# Patient Record
Sex: Female | Born: 1950 | Race: Black or African American | Hispanic: No | Marital: Single | Smoking: Never smoker
Health system: Southern US, Community
[De-identification: ages and names within clinical notes are randomized; demographics above are authoritative.]

## PROBLEM LIST (undated history)

## (undated) DIAGNOSIS — E119 Type 2 diabetes mellitus without complications: Secondary | ICD-10-CM

## (undated) DIAGNOSIS — K648 Other hemorrhoids: Secondary | ICD-10-CM

## (undated) DIAGNOSIS — E669 Obesity, unspecified: Secondary | ICD-10-CM

## (undated) DIAGNOSIS — H9313 Tinnitus, bilateral: Secondary | ICD-10-CM

## (undated) DIAGNOSIS — Z8679 Personal history of other diseases of the circulatory system: Secondary | ICD-10-CM

## (undated) DIAGNOSIS — E049 Nontoxic goiter, unspecified: Secondary | ICD-10-CM

## (undated) DIAGNOSIS — Z801 Family history of malignant neoplasm of trachea, bronchus and lung: Secondary | ICD-10-CM

## (undated) DIAGNOSIS — Z8719 Personal history of other diseases of the digestive system: Secondary | ICD-10-CM

## (undated) DIAGNOSIS — R1312 Dysphagia, oropharyngeal phase: Secondary | ICD-10-CM

## (undated) DIAGNOSIS — K5790 Diverticulosis of intestine, part unspecified, without perforation or abscess without bleeding: Secondary | ICD-10-CM

## (undated) DIAGNOSIS — M542 Cervicalgia: Secondary | ICD-10-CM

## (undated) DIAGNOSIS — K219 Gastro-esophageal reflux disease without esophagitis: Secondary | ICD-10-CM

## (undated) DIAGNOSIS — F411 Generalized anxiety disorder: Secondary | ICD-10-CM

## (undated) DIAGNOSIS — R252 Cramp and spasm: Secondary | ICD-10-CM

## (undated) DIAGNOSIS — E785 Hyperlipidemia, unspecified: Secondary | ICD-10-CM

## (undated) DIAGNOSIS — I700 Atherosclerosis of aorta: Secondary | ICD-10-CM

## (undated) DIAGNOSIS — Z800 Family history of malignant neoplasm of digestive organs: Secondary | ICD-10-CM

## (undated) DIAGNOSIS — Z83719 Family history of colon polyps, unspecified: Secondary | ICD-10-CM

## (undated) DIAGNOSIS — Z8041 Family history of malignant neoplasm of ovary: Secondary | ICD-10-CM

## (undated) DIAGNOSIS — J309 Allergic rhinitis, unspecified: Secondary | ICD-10-CM

## (undated) DIAGNOSIS — R7989 Other specified abnormal findings of blood chemistry: Secondary | ICD-10-CM

## (undated) DIAGNOSIS — G629 Polyneuropathy, unspecified: Secondary | ICD-10-CM

## (undated) DIAGNOSIS — D229 Melanocytic nevi, unspecified: Secondary | ICD-10-CM

## (undated) DIAGNOSIS — N393 Stress incontinence (female) (male): Secondary | ICD-10-CM

## (undated) DIAGNOSIS — Z8371 Family history of colonic polyps: Secondary | ICD-10-CM

## (undated) DIAGNOSIS — R296 Repeated falls: Secondary | ICD-10-CM

## (undated) DIAGNOSIS — Z806 Family history of leukemia: Secondary | ICD-10-CM

## (undated) DIAGNOSIS — Z8042 Family history of malignant neoplasm of prostate: Secondary | ICD-10-CM

## (undated) DIAGNOSIS — M7551 Bursitis of right shoulder: Secondary | ICD-10-CM

## (undated) DIAGNOSIS — R42 Dizziness and giddiness: Secondary | ICD-10-CM

## (undated) DIAGNOSIS — M1990 Unspecified osteoarthritis, unspecified site: Secondary | ICD-10-CM

## (undated) DIAGNOSIS — M5412 Radiculopathy, cervical region: Secondary | ICD-10-CM

## (undated) DIAGNOSIS — R591 Generalized enlarged lymph nodes: Secondary | ICD-10-CM

## (undated) HISTORY — DX: Allergic rhinitis, unspecified: J30.9

## (undated) HISTORY — PX: CHOLECYSTECTOMY: SHX55

## (undated) HISTORY — DX: Nontoxic goiter, unspecified: E04.9

## (undated) HISTORY — DX: Family history of leukemia: Z80.6

## (undated) HISTORY — DX: Dysphagia, oropharyngeal phase: R13.12

## (undated) HISTORY — DX: Family history of malignant neoplasm of prostate: Z80.42

## (undated) HISTORY — DX: Unspecified osteoarthritis, unspecified site: M19.90

## (undated) HISTORY — DX: Obesity, unspecified: E66.9

## (undated) HISTORY — DX: Personal history of other diseases of the circulatory system: Z86.79

## (undated) HISTORY — DX: Other specified abnormal findings of blood chemistry: R79.89

## (undated) HISTORY — DX: Repeated falls: R29.6

## (undated) HISTORY — DX: Family history of colonic polyps: Z83.71

## (undated) HISTORY — DX: Generalized enlarged lymph nodes: R59.1

## (undated) HISTORY — DX: Atherosclerosis of aorta: I70.0

## (undated) HISTORY — DX: Hyperlipidemia, unspecified: E78.5

## (undated) HISTORY — DX: Tinnitus, bilateral: H93.13

## (undated) HISTORY — DX: Stress incontinence (female) (male): N39.3

## (undated) HISTORY — PX: TONSILLECTOMY AND ADENOIDECTOMY: SUR1326

## (undated) HISTORY — DX: Dizziness and giddiness: R42

## (undated) HISTORY — DX: Cervicalgia: M54.2

## (undated) HISTORY — DX: Radiculopathy, cervical region: M54.12

## (undated) HISTORY — DX: Diverticulosis of intestine, part unspecified, without perforation or abscess without bleeding: K57.90

## (undated) HISTORY — DX: Generalized anxiety disorder: F41.1

## (undated) HISTORY — DX: Gastro-esophageal reflux disease without esophagitis: K21.9

## (undated) HISTORY — DX: Family history of malignant neoplasm of ovary: Z80.41

## (undated) HISTORY — DX: Personal history of other diseases of the digestive system: Z87.19

## (undated) HISTORY — DX: Cramp and spasm: R25.2

## (undated) HISTORY — DX: Melanocytic nevi, unspecified: D22.9

## (undated) HISTORY — DX: Other hemorrhoids: K64.8

## (undated) HISTORY — DX: Family history of malignant neoplasm of digestive organs: Z80.0

## (undated) HISTORY — DX: Family history of malignant neoplasm of trachea, bronchus and lung: Z80.1

## (undated) HISTORY — DX: Family history of colon polyps, unspecified: Z83.719

## (undated) HISTORY — DX: Hypomagnesemia: E83.42

## (undated) HISTORY — DX: Polyneuropathy, unspecified: G62.9

## (undated) HISTORY — DX: Bursitis of right shoulder: M75.51

---

## 1992-10-23 HISTORY — PX: LAPAROSCOPIC TUBAL LIGATION: SUR803

## 1998-10-23 HISTORY — PX: COLONOSCOPY: SHX174

## 1998-12-22 HISTORY — PX: ESOPHAGOGASTRODUODENOSCOPY: SHX1529

## 2000-10-23 HISTORY — PX: ESOPHAGEAL DILATION: SHX303

## 2003-10-24 HISTORY — PX: COLONOSCOPY: SHX174

## 2004-08-23 HISTORY — PX: ESOPHAGOGASTRODUODENOSCOPY: SHX1529

## 2004-09-01 ENCOUNTER — Ambulatory Visit: Payer: Self-pay | Admitting: Gastroenterology

## 2005-12-22 ENCOUNTER — Ambulatory Visit: Payer: Self-pay | Admitting: Internal Medicine

## 2007-01-03 ENCOUNTER — Ambulatory Visit: Payer: Self-pay | Admitting: Internal Medicine

## 2007-06-08 ENCOUNTER — Encounter: Admission: RE | Admit: 2007-06-08 | Discharge: 2007-06-08 | Payer: Self-pay | Admitting: Internal Medicine

## 2008-01-15 ENCOUNTER — Ambulatory Visit: Payer: Self-pay | Admitting: Internal Medicine

## 2008-04-26 ENCOUNTER — Emergency Department: Payer: Self-pay | Admitting: Emergency Medicine

## 2009-10-24 ENCOUNTER — Ambulatory Visit: Payer: Self-pay | Admitting: Orthopedic Surgery

## 2009-11-30 ENCOUNTER — Ambulatory Visit: Payer: Self-pay | Admitting: Pain Medicine

## 2009-12-16 ENCOUNTER — Ambulatory Visit: Payer: Self-pay | Admitting: Pain Medicine

## 2009-12-23 ENCOUNTER — Ambulatory Visit: Payer: Self-pay | Admitting: Pain Medicine

## 2010-01-06 ENCOUNTER — Ambulatory Visit: Payer: Self-pay | Admitting: Pain Medicine

## 2010-01-20 ENCOUNTER — Ambulatory Visit: Payer: Self-pay | Admitting: Pain Medicine

## 2010-03-31 ENCOUNTER — Ambulatory Visit: Payer: Self-pay | Admitting: Internal Medicine

## 2011-06-13 ENCOUNTER — Ambulatory Visit: Payer: Self-pay | Admitting: Internal Medicine

## 2011-07-24 HISTORY — PX: COLONOSCOPY: SHX174

## 2011-07-27 ENCOUNTER — Ambulatory Visit: Payer: Self-pay | Admitting: Gastroenterology

## 2011-07-27 HISTORY — PX: ESOPHAGOGASTRODUODENOSCOPY: SHX1529

## 2011-07-28 ENCOUNTER — Emergency Department: Payer: Self-pay | Admitting: Emergency Medicine

## 2011-08-02 LAB — PATHOLOGY REPORT

## 2013-05-05 ENCOUNTER — Emergency Department: Payer: Self-pay | Admitting: Emergency Medicine

## 2014-01-01 ENCOUNTER — Ambulatory Visit: Payer: Self-pay | Admitting: Internal Medicine

## 2014-01-20 ENCOUNTER — Ambulatory Visit: Payer: Self-pay | Admitting: Gastroenterology

## 2014-01-28 ENCOUNTER — Ambulatory Visit: Payer: Self-pay | Admitting: Gastroenterology

## 2014-02-12 ENCOUNTER — Ambulatory Visit: Payer: Self-pay | Admitting: Gastroenterology

## 2014-03-26 ENCOUNTER — Ambulatory Visit: Payer: Self-pay | Admitting: Surgery

## 2014-04-03 ENCOUNTER — Ambulatory Visit: Payer: Self-pay | Admitting: Surgery

## 2014-04-06 LAB — PATHOLOGY REPORT

## 2015-02-13 NOTE — Op Note (Signed)
PATIENT NAME:  Kayla Love, Kayla Love MR#:  [id number] DATE OF BIRTH:  1951-07-16  DATE OF PROCEDURE:  04/03/2014  PREOPERATIVE DIAGNOSIS:  Ventral hernia, chronic cholecystitis, cholelithiasis.   POSTOPERATIVE DIAGNOSIS:  Ventral hernia, acute cholecystitis, cholelithiasis.   PROCEDURES PERFORMED: Laparoscopic cholecystectomy and open ventral hernia repair.   SURGEON:  Rochel Brome, M.D.   ANESTHESIA:  General.   INDICATIONS: This 64 year old female has a history of epigastric pains, also had physical findings of an epigastric ventral hernia, which was small in size, but symptomatic. She also had ultrasound findings of gallstones. Surgery was recommended for definitive treatment.   DESCRIPTION OF PROCEDURE:  The patient was placed on the operating table in the supine position under general endotracheal anesthesia. The abdomen was prepared with ChloraPrep and draped in a sterile manner.   The ventral hernia was done first so as to isolate it from potential contamination related to the gallbladder. A longitudinal incision was made in the epigastrium just approximately 3 cm in length with the lower end just above the umbilicus. This was carried down through subcutaneous tissues. Several small bleeding points were cauterized. There was a ventral hernia sac, which was encountered, which was approximately 3 cm in length, which was dissected free from surrounding structures down to a fascial ring defect, which was approximately 1 cm in dimension. The peritoneum of the sac was dissected away from the fascial ring defect and was inverted. Next, the repair was carried out with a transversely oriented suture line of interrupted 0 Maxon figure-of-eight sutures. The repair looked good. Hemostasis was intact. Subcutaneous tissues were approximated with 4-0 Monocryl pursestring suture. Next, the skin was closed with a running 4-0 Monocryl subcuticular suture and Dermabond.   Subsequently, the next incision was made in  the infraumbilical area at the site of an old scar, carried down through subcutaneous tissues to encounter the deep fascia, which was grasped with laryngeal hook and elevated. A Veress needle was inserted, aspirated and irrigated with a saline solution. Next, the peritoneal cavity was inflated with carbon dioxide. The Veress needle was removed. A 10 mm cannula was inserted. A 10 mm, 0 degree laparoscope was inserted to view the peritoneal cavity. There was a finding of a fatty liver, but no evidence of cirrhosis. There was distention of the stomach, and I did have the anesthetist insert an orogastric tube to decompress the stomach. Another incision was made in the epigastrium, slightly to the right of the midline to introduce an 11 mm cannula. Two other incisions were made in the lateral aspect of the right upper quadrant to introduce two 5 mm cannulas.   The patient was placed in the reverse Trendelenburg position and turned several degrees to the left and inspection was carried out. There did appear to be a mass in the right upper quadrant which, with initial inspection, appeared that this may be related to her colon, as it was large and was mobile and appeared to be somewhat separate from the liver. There was some peritoneum on the inferior surface of the liver, which appeared that it may be gallbladder and was retracted. Some dissection was carried out and dissected out what appeared to be peritoneum. It was unclear whether this was the gallbladder or not. A fragment of tissue was resected with hook and cautery and after it was removed, it could be seen that it was a segment of visceral peritoneum approximately 4 cm in length. Further inspection was carried out and further inspected this mass, which was  covered with omentum. Somewhat tedious dissection was undertaken and did change to the 30 degree scope and found that this was actually a hydrops of the gallbladder. The omentum was separated with blunt and  sharp dissection. Numerous small bleeding points were cauterized, and as the omentum was peeled away, it became apparent that this was the gallbladder, and after verifying that it was the gallbladder, it was decompressed with a lancing needle, draining some thick-appearing brown color bile, and subsequently the gallbladder could be grasped with a grasper and retracted towards the right shoulder. Additional tedious dissection was carried out mobilizing the omentum away from the gallbladder and reaching the neck of the gallbladder there were fewer adhesions. Location of the porta hepatis was demonstrated. The gallbladder was followed down to the cystic duct. There was no apparent separate cystic artery, other than dividing numerous portions of visceral peritoneum with the hook and cautery. Next, an incision was made in the right paramedian area to insert a 5 mm cannula and inserted a microdissector to retract the liver for better exposure and some further dissection was carried out until this was dissected down to verify the cystic duct. Subsequently, this was controlled with 3 endoclips proximally and 1 distally and divided. The gallbladder was placed into an Endo Catch bag and pulled up to the infraumbilical port. The operative site was copiously irrigated with heparinized saline solution multiple times through the procedure. Hemostasis subsequently appeared to be intact.  The Endo Catch bag was pulled up, and it was necessary to lengthen the infraumbilical incision to about 3 cm in length and lengthen the fascial defect and open the gallbladder and suctioned additional bile out, and then with additional traction, the gallbladder and Endo Catch bag were removed. The gallbladder was submitted. There were a number of fine stones within the gallbladder and it was all submitted for routine pathology. The right upper quadrant was further inspected, irrigated and aspirated. Hemostasis appeared to be intact. All of the  cannulas were removed. Several small subcutaneous bleeding points were cauterized. The fascial defect at the umbilicus was closed with 0 Maxon figure-of-eight suture, and then the skin incisions were closed with interrupted 4-0 chromic subcuticular sutures, benzoin and Steri-Strips. The dressings were applied with paper tape. The patient appeared to tolerate the procedure satisfactorily and was prepared for transfer to the recovery room. This case was significantly more difficult than the average case with findings of acute hydrops of the gallbladder with some gangrenous changes of the gallbladder wall and dense adherence of surrounding omentum. The operation lasted 3 hours and just approximately 20 minutes of the operation was spent repairing  the ventral hernia. The ventral hernia was done first to avoid contamination.     ____________________________ Lenna Sciara. Rochel Brome, MD jws:dmm D: 04/03/2014 12:40:03 ET T: 04/03/2014 12:51:34 ET JOB#: [id number]  cc: Loreli Dollar, MD, <Dictator> Loreli Dollar MD ELECTRONICALLY SIGNED 04/06/2014 8:24

## 2015-10-20 ENCOUNTER — Emergency Department
Admission: EM | Admit: 2015-10-20 | Discharge: 2015-10-20 | Disposition: A | Discharge status: Home / Self Care | Payer: Self-pay | Attending: Emergency Medicine | Admitting: Emergency Medicine

## 2015-10-20 ENCOUNTER — Encounter: Payer: Self-pay | Admitting: Medical Oncology

## 2015-10-20 DIAGNOSIS — R197 Diarrhea, unspecified: Secondary | ICD-10-CM | POA: Insufficient documentation

## 2015-10-20 DIAGNOSIS — R42 Dizziness and giddiness: Secondary | ICD-10-CM | POA: Insufficient documentation

## 2015-10-20 DIAGNOSIS — R52 Pain, unspecified: Secondary | ICD-10-CM | POA: Insufficient documentation

## 2015-10-20 DIAGNOSIS — R739 Hyperglycemia, unspecified: Secondary | ICD-10-CM | POA: Insufficient documentation

## 2015-10-20 DIAGNOSIS — R112 Nausea with vomiting, unspecified: Secondary | ICD-10-CM | POA: Insufficient documentation

## 2015-10-20 LAB — CBC
HCT: 38.4 % (ref 35.0–47.0)
Hemoglobin: 12.6 g/dL (ref 12.0–16.0)
MCH: 29.4 pg (ref 26.0–34.0)
MCHC: 32.8 g/dL (ref 32.0–36.0)
MCV: 89.5 fL (ref 80.0–100.0)
PLATELETS: 249 10*3/uL (ref 150–440)
RBC: 4.29 MIL/uL (ref 3.80–5.20)
RDW: 13.2 % (ref 11.5–14.5)
WBC: 6 10*3/uL (ref 3.6–11.0)

## 2015-10-20 LAB — COMPREHENSIVE METABOLIC PANEL
ALK PHOS: 40 U/L (ref 38–126)
ALT: 29 U/L (ref 14–54)
AST: 22 U/L (ref 15–41)
Albumin: 4 g/dL (ref 3.5–5.0)
Anion gap: 9 (ref 5–15)
BILIRUBIN TOTAL: 0.5 mg/dL (ref 0.3–1.2)
BUN: 12 mg/dL (ref 6–20)
CALCIUM: 8.7 mg/dL — AB (ref 8.9–10.3)
CHLORIDE: 106 mmol/L (ref 101–111)
CO2: 25 mmol/L (ref 22–32)
CREATININE: 0.74 mg/dL (ref 0.44–1.00)
Glucose, Bld: 221 mg/dL — ABNORMAL HIGH (ref 65–99)
Potassium: 3.6 mmol/L (ref 3.5–5.1)
Sodium: 140 mmol/L (ref 135–145)
TOTAL PROTEIN: 7 g/dL (ref 6.5–8.1)

## 2015-10-20 LAB — LIPASE, BLOOD: LIPASE: 29 U/L (ref 11–51)

## 2015-10-20 LAB — GLUCOSE, CAPILLARY: GLUCOSE-CAPILLARY: 94 mg/dL (ref 65–99)

## 2015-10-20 MED ORDER — MECLIZINE HCL 25 MG PO TABS
25.0000 mg | ORAL_TABLET | Freq: Once | ORAL | Status: AC
  Administered 2015-10-20: 25 mg via ORAL
  Filled 2015-10-20: qty 1

## 2015-10-20 MED ORDER — ONDANSETRON HCL 4 MG/2ML IJ SOLN
4.0000 mg | Freq: Once | INTRAMUSCULAR | Status: AC
  Administered 2015-10-20: 4 mg via INTRAVENOUS

## 2015-10-20 MED ORDER — MECLIZINE HCL 25 MG PO TABS: 25.0000 mg | ORAL_TABLET | Freq: Three times a day (TID) | ORAL | Status: DC | PRN

## 2015-10-20 MED ORDER — SODIUM CHLORIDE 0.9 % IV BOLUS (SEPSIS)
1000.0000 mL | Freq: Once | INTRAVENOUS | Status: AC
  Administered 2015-10-20: 1000 mL via INTRAVENOUS

## 2015-10-20 MED ORDER — ONDANSETRON HCL 4 MG/2ML IJ SOLN
INTRAMUSCULAR | Status: AC
  Administered 2015-10-20: 4 mg via INTRAVENOUS
  Filled 2015-10-20: qty 2

## 2015-10-20 NOTE — ED Provider Notes (Signed)
[facility] Emergency Department Provider Note    ____________________________________________  Time seen: 1400  I have reviewed the triage vital signs and the nursing notes.   HISTORY  Chief Complaint Dizziness; Generalized Body Aches; and Emesis   History limited by: Not Limited   HPI Kayla Love is a 64 y.o. female who presents to the emergency department today with multiple medical complaints. She is complaining of body aches, nausea vomiting and dizziness. The symptoms started yesterday. She states the dizziness occurs primarily when she sits up or gets up. She will need to support to walk around. Additionally she has had a full body aches. She has had multiple episodes of nausea and vomiting. No obvious sick contacts that she can recall. Patient denies any fevers. States that she has a family history of diabetes but has never been diagnosed with diabetes herself.   History reviewed. No pertinent past medical history.  There are no active problems to display for this patient.   Past Surgical History  Procedure Laterality Date  . Cholecystectomy      No current outpatient prescriptions on file.  Allergies Codeine and Shellfish allergy  No family history on file.  Social History Social History  Substance Use Topics  . Smoking status: Never Smoker   . Smokeless tobacco: None  . Alcohol Use: No    Review of Systems  Constitutional: Negative for fever. Cardiovascular: Negative for chest pain. Respiratory: Negative for shortness of breath. Gastrointestinal: Positive for nausea vomiting and diarrhea. Neurological: Negative for headaches, focal weakness or numbness.  10-point ROS otherwise negative.  ____________________________________________   PHYSICAL EXAM:  VITAL SIGNS: ED Triage Vitals  Enc Vitals Group     BP 10/20/15 1050 127/55 mmHg     Pulse Rate 10/20/15 1050 85     Resp 10/20/15 1050 18     Temp 10/20/15 1050 98.2  F (36.8 C)     Temp Source 10/20/15 1050 Oral     SpO2 10/20/15 1050 97 %     Weight 10/20/15 1050 210 lb (95.255 kg)     Height 10/20/15 1050 5\' 5"  (1.651 m)     Head Cir --      Peak Flow --      Pain Score 10/20/15 1051 8   Constitutional: Alert and oriented. Well appearing and in no distress. Eyes: Conjunctivae are normal. PERRL. Normal extraocular movements. ENT   Head: Normocephalic and atraumatic.   Nose: No congestion/rhinnorhea.   Mouth/Throat: Mucous membranes are moist.   Neck: No stridor. Hematological/Lymphatic/Immunilogical: No cervical lymphadenopathy. Cardiovascular: Normal rate, regular rhythm.  No murmurs, rubs, or gallops. Respiratory: Normal respiratory effort without tachypnea nor retractions. Breath sounds are clear and equal bilaterally. No wheezes/rales/rhonchi. Gastrointestinal: Soft and nontender. No distention.  Genitourinary: Deferred Musculoskeletal: Normal range of motion in all extremities. No joint effusions.  No lower extremity tenderness nor edema. Neurologic:  Normal speech and language. No gross focal neurologic deficits are appreciated.  Skin:  Skin is warm, dry and intact. No rash noted. Psychiatric: Mood and affect are normal. Speech and behavior are normal. Patient exhibits appropriate insight and judgment.  ____________________________________________    LABS (pertinent positives/negatives)  Labs Reviewed  COMPREHENSIVE METABOLIC PANEL - Abnormal; Notable for the following:    Glucose, Bld 221 (*)    Calcium 8.7 (*)    All other components within normal limits  LIPASE, BLOOD  CBC  URINALYSIS COMPLETEWITH MICROSCOPIC ([facility] ONLY)     ____________________________________________   EKG  Apolonio Schneiders, attending physician, personally viewed and interpreted this EKG  EKG Time: 1057 Rate: 76 Rhythm: NSR Axis: left axis deviation Intervals: qtc 454 QRS: incomplete RBBB ST changes: no st elevation, t wave  inversinon V Impression: abnormal ekg ____________________________________________    RADIOLOGY  None   ____________________________________________   PROCEDURES  Procedure(s) performed: None  Critical Care performed: No  ____________________________________________   INITIAL IMPRESSION / ASSESSMENT AND PLAN / ED COURSE  Pertinent labs & imaging results that were available during my care of the patient were reviewed by me and considered in my medical decision making (see chart for details).  Patient's blood work did show hyper glycemia. Patient without any history of known diabetes. Patient did feel better after IV fluids and medications. This point feel patient safe for discharge home. While patient follow-up with primary care.  ____________________________________________   FINAL CLINICAL IMPRESSION(S) / ED DIAGNOSES  Final diagnoses:  Elevated blood sugar  Dizziness     Nance Pear, MD 10/21/15 1510

## 2015-10-20 NOTE — Discharge Instructions (Signed)
Please seek medical attention for any high fevers, chest pain, shortness of breath, change in behavior, persistent vomiting, bloody stool or any other new or concerning symptoms.   Benign Positional Vertigo Vertigo is the feeling that you or your surroundings are moving when they are not. Benign positional vertigo is the most common form of vertigo. The cause of this condition is not serious (is benign). This condition is triggered by certain movements and positions (is positional). This condition can be dangerous if it occurs while you are doing something that could endanger you or others, such as driving.  CAUSES In many cases, the cause of this condition is not known. It may be caused by a disturbance in an area of the inner ear that helps your brain to sense movement and balance. This disturbance can be caused by a viral infection (labyrinthitis), head injury, or repetitive motion. RISK FACTORS This condition is more likely to develop in:  Women.  People who are 95 years of age or older. SYMPTOMS Symptoms of this condition usually happen when you move your head or your eyes in different directions. Symptoms may start suddenly, and they usually last for less than a minute. Symptoms may include:  Loss of balance and falling.  Feeling like you are spinning or moving.  Feeling like your surroundings are spinning or moving.  Nausea and vomiting.  Blurred vision.  Dizziness.  Involuntary eye movement (nystagmus). Symptoms can be mild and cause only slight annoyance, or they can be severe and interfere with daily life. Episodes of benign positional vertigo may return (recur) over time, and they may be triggered by certain movements. Symptoms may improve over time. DIAGNOSIS This condition is usually diagnosed by medical history and a physical exam of the head, neck, and ears. You may be referred to a health care provider who specializes in ear, nose, and throat (ENT) problems  (otolaryngologist) or a provider who specializes in disorders of the nervous system (neurologist). You may have additional testing, including:  MRI.  A CT scan.  Eye movement tests. Your health care provider may ask you to change positions quickly while he or she watches you for symptoms of benign positional vertigo, such as nystagmus. Eye movement may be tested with an electronystagmogram (ENG), caloric stimulation, the Dix-Hallpike test, or the roll test.  An electroencephalogram (EEG). This records electrical activity in your brain.  Hearing tests. TREATMENT Usually, your health care provider will treat this by moving your head in specific positions to adjust your inner ear back to normal. Surgery may be needed in severe cases, but this is rare. In some cases, benign positional vertigo may resolve on its own in 2-4 weeks. HOME CARE INSTRUCTIONS Safety  Move slowly.Avoid sudden body or head movements.  Avoid driving.  Avoid operating heavy machinery.  Avoid doing any tasks that would be dangerous to you or others if a vertigo episode would occur.  If you have trouble walking or keeping your balance, try using a cane for stability. If you feel dizzy or unstable, sit down right away.  Return to your normal activities as told by your health care provider. Ask your health care provider what activities are safe for you. General Instructions  Take over-the-counter and prescription medicines only as told by your health care provider.  Avoid certain positions or movements as told by your health care provider.  Drink enough fluid to keep your urine clear or pale yellow.  Keep all follow-up visits as told by your health  care provider. This is important. SEEK MEDICAL CARE IF:  You have a fever.  Your condition gets worse or you develop new symptoms.  Your family or friends notice any behavioral changes.  Your nausea or vomiting gets worse.  You have numbness or a "pins and  needles" sensation. SEEK IMMEDIATE MEDICAL CARE IF:  You have difficulty speaking or moving.  You are always dizzy.  You faint.  You develop severe headaches.  You have weakness in your legs or arms.  You have changes in your hearing or vision.  You develop a stiff neck.  You develop sensitivity to light.   This information is not intended to replace advice given to you by your health care provider. Make sure you discuss any questions you have with your health care provider.   Document Released: 07/17/2006 Document Revised: 06/30/2015 Document Reviewed: 02/01/2015 Elsevier Interactive Patient Education [employer].

## 2015-10-20 NOTE — ED Notes (Signed)
Pt ambulatory to triage with reports of body aches, dizziness, nausea and vomiting that began yesterday. Denies fever.

## 2016-10-31 ENCOUNTER — Other Ambulatory Visit: Payer: Self-pay | Admitting: Nurse Practitioner

## 2016-10-31 ENCOUNTER — Ambulatory Visit
Admission: RE | Admit: 2016-10-31 | Discharge: 2016-10-31 | Disposition: A | Discharge status: Home / Self Care | Payer: BLUE CROSS/BLUE SHIELD | Source: Ambulatory Visit | Attending: Nurse Practitioner | Admitting: Nurse Practitioner

## 2016-10-31 DIAGNOSIS — M19011 Primary osteoarthritis, right shoulder: Secondary | ICD-10-CM | POA: Diagnosis not present

## 2016-10-31 DIAGNOSIS — M7550 Bursitis of unspecified shoulder: Secondary | ICD-10-CM | POA: Insufficient documentation

## 2016-10-31 DIAGNOSIS — M25511 Pain in right shoulder: Secondary | ICD-10-CM | POA: Insufficient documentation

## 2016-12-20 ENCOUNTER — Emergency Department
Admission: EM | Admit: 2016-12-20 | Discharge: 2016-12-20 | Disposition: A | Discharge status: Home / Self Care | Payer: BLUE CROSS/BLUE SHIELD | Attending: Emergency Medicine | Admitting: Emergency Medicine

## 2016-12-20 ENCOUNTER — Emergency Department: Payer: BLUE CROSS/BLUE SHIELD

## 2016-12-20 DIAGNOSIS — Z791 Long term (current) use of non-steroidal anti-inflammatories (NSAID): Secondary | ICD-10-CM | POA: Insufficient documentation

## 2016-12-20 DIAGNOSIS — R42 Dizziness and giddiness: Secondary | ICD-10-CM | POA: Diagnosis not present

## 2016-12-20 LAB — BASIC METABOLIC PANEL
ANION GAP: 7 (ref 5–15)
BUN: 12 mg/dL (ref 6–20)
CALCIUM: 9 mg/dL (ref 8.9–10.3)
CHLORIDE: 105 mmol/L (ref 101–111)
CO2: 27 mmol/L (ref 22–32)
Creatinine, Ser: 0.81 mg/dL (ref 0.44–1.00)
GFR calc Af Amer: >60 mL/min (ref >60)
GFR calc non Af Amer: >60 mL/min (ref >60)
GLUCOSE: 105 mg/dL — AB (ref 65–99)
Potassium: 3.9 mmol/L (ref 3.5–5.1)
Sodium: 139 mmol/L (ref 135–145)

## 2016-12-20 LAB — CBC
HEMATOCRIT: 36.7 % (ref 35.0–47.0)
HEMOGLOBIN: 12.5 g/dL (ref 12.0–16.0)
MCH: 30.8 pg (ref 26.0–34.0)
MCHC: 34.1 g/dL (ref 32.0–36.0)
MCV: 90.4 fL (ref 80.0–100.0)
Platelets: 278 10*3/uL (ref 150–440)
RBC: 4.06 MIL/uL (ref 3.80–5.20)
RDW: 13 % (ref 11.5–14.5)
WBC: 6.1 10*3/uL (ref 3.6–11.0)

## 2016-12-20 MED ORDER — MECLIZINE HCL 25 MG PO TABS
25.0000 mg | ORAL_TABLET | Freq: Once | ORAL | Status: AC
  Administered 2016-12-20: 25 mg via ORAL
  Filled 2016-12-20: qty 1

## 2016-12-20 MED ORDER — MECLIZINE HCL 25 MG PO TABS: 25.0000 mg | ORAL_TABLET | Freq: Three times a day (TID) | ORAL | 0 refills | Status: DC | PRN

## 2016-12-20 NOTE — ED Triage Notes (Signed)
She arrives to triage with reports of dizziness that began on Monday am as she awakened  Pt reports dizziness throughout the day over the last two days with an unknown cause   Nauseated at times - denies emesis

## 2016-12-20 NOTE — ED Provider Notes (Signed)
[facility] Emergency Department Provider Note  Time seen: 11:38 AM  I have reviewed the triage vital signs and the nursing notes.   HISTORY  Chief Complaint Dizziness and Nausea    HPI Kayla Love is a 66 y.o. female With a past medical history of vertigo, presents to the emergency department with dizziness. According to the patient over the past 2 or 3 days she has been feeling dizzy and had one episode of vomiting 2 days ago. Patient states a history of dizziness/vertigo in the past, but states it usually does not last this long. Patient was concerned as the symptoms have not gone away completely so she came to the emergency department for evaluation. Patient states the symptoms are intermittent, much if she standp or is walking around, goes away completely if she is lying down including currentl Denies any focal weakness or numbness or difficulty with speech. Denies any history of stroke/TIA.  No past medical history on file.  There are no active problems to display for this patient.   Past Surgical History:  Procedure Laterality Date  . CHOLECYSTECTOMY      Prior to Admission medications   Medication Sig Start Date End Date Taking? Authorizing Provider  meclizine (ANTIVERT) 25 MG tablet Take 1 tablet (25 mg total) by mouth 3 (three) times daily as needed for dizziness. 10/20/15   Nance Pear, MD    Allergies  Allergen Reactions  . Codeine   . Shellfish Allergy     No family history on file.  Social History Social History  Substance Use Topics  . Smoking status: Never Smoker  . Smokeless tobacco: Never Used  . Alcohol use No    Review of Systems Constitutional: Negative for fever Cardiovascular: Negative for chest pain. Respiratory: Negative for shortness of breath. Gastrointestinal: Negative for abdominal pain Genitourinary: Negative for dysuria. Neurological: Negative for headaches, focal weakness or numbness. 10-point ROS  otherwise negative.  ____________________________________________   PHYSICAL EXAM:  VITAL SIGNS: ED Triage Vitals  Enc Vitals Group     BP 12/20/16 1057 (!) 143/80     Pulse Rate 12/20/16 1057 83     Resp 12/20/16 1057 18     Temp 12/20/16 1057 98.6 F (37 C)     Temp Source 12/20/16 1057 Oral     SpO2 12/20/16 1057 97 %     Weight 12/20/16 1058 209 lb (94.8 kg)     Height 12/20/16 1058 5\' 5"  (1.651 m)     Head Circumference --      Peak Flow --      Pain Score 12/20/16 1102 7     Pain Loc --      Pain Edu? --      Excl. in [place]? --     Constitutional: Alert and oriented. Well appearing and in no distress. Eyes: Normal exam ENT   Head: Normocephalic and atraumatic.   Mouth/Throat: Mucous membranes are moist. Cardiovascular: Normal rate, regular rhythm. No murmur Respiratory: Normal respiratory effort without tachypnea nor retractions. Breath sounds are clear Gastrointestinal: Soft and nontender. No distention.  Musculoskeletal: Nontender with normal range of motion in all extremities. Neurologic:  Normal speech and language. No gross focal neurologic deficits are appreciated.equal grip strengths bilaterally. Finger to nose testing equal bilaterally. 5/5 motor in all extremities. No cranial nerve deficits. Skin:  Skin is warm, dry and intact.  Psychiatric: Mood and affect are normal. Speech and behavior are normal.   ____________________________________________    EKG  EKG reviewed and interpreted, so shows normal sinus rhythm at 83 bpm, narrow QRS, normal axis, normal intervals, no concerning ST changes.  ____________________________________________    RADIOLOGY  CT head is negative  ____________________________________________   INITIAL IMPRESSION / ASSESSMENT AND PLAN / ED COURSE  Pertinent labs & imaging results that were available during my care of the patient were reviewed by me and considered in my medical decision making (see chart for  details).  the patient presents to the emergency department with dizziness ongoing for the past 3 days. States it is intermittent and worse when she moves her head turns or sits up. Currently denies any dizziness. Has a normal neurological exam. We'll check basic labs CT scan of the head given ongoing symptoms for 3 days and treat with meclizine for symptom improvement in the emergency department.  CT scan of head is negative. Patient's labs are largely within normal limits. Denies any dizziness currently. We'll attempt to ambulate the patient in the emergency department.  Patient is able to ambulate in the emergency department. No apparent difficulty.  ____________________________________________   FINAL CLINICAL IMPRESSION(S) / ED DIAGNOSES  dizziness    Harvest Dark, MD 12/20/16 [phone]

## 2017-03-21 ENCOUNTER — Other Ambulatory Visit: Payer: Self-pay | Admitting: Nurse Practitioner

## 2017-03-21 DIAGNOSIS — Z1239 Encounter for other screening for malignant neoplasm of breast: Secondary | ICD-10-CM

## 2017-05-18 ENCOUNTER — Ambulatory Visit
Admission: RE | Admit: 2017-05-18 | Discharge: 2017-05-18 | Disposition: A | Discharge status: Home / Self Care | Payer: BLUE CROSS/BLUE SHIELD | Source: Ambulatory Visit | Attending: Nurse Practitioner | Admitting: Nurse Practitioner

## 2017-05-18 DIAGNOSIS — Z1231 Encounter for screening mammogram for malignant neoplasm of breast: Secondary | ICD-10-CM | POA: Diagnosis present

## 2017-05-18 DIAGNOSIS — Z1239 Encounter for other screening for malignant neoplasm of breast: Secondary | ICD-10-CM

## 2017-12-24 ENCOUNTER — Other Ambulatory Visit: Payer: Self-pay

## 2017-12-24 ENCOUNTER — Ambulatory Visit
Admission: EM | Admit: 2017-12-24 | Discharge: 2017-12-24 | Disposition: A | Discharge status: Home / Self Care | Payer: BLUE CROSS/BLUE SHIELD | Attending: Family Medicine | Admitting: Family Medicine

## 2017-12-24 ENCOUNTER — Ambulatory Visit (INDEPENDENT_AMBULATORY_CARE_PROVIDER_SITE_OTHER): Payer: BLUE CROSS/BLUE SHIELD

## 2017-12-24 ENCOUNTER — Encounter: Payer: Self-pay | Admitting: Emergency Medicine

## 2017-12-24 DIAGNOSIS — S39012A Strain of muscle, fascia and tendon of lower back, initial encounter: Secondary | ICD-10-CM | POA: Diagnosis not present

## 2017-12-24 DIAGNOSIS — W19XXXA Unspecified fall, initial encounter: Secondary | ICD-10-CM

## 2017-12-24 DIAGNOSIS — S7002XA Contusion of left hip, initial encounter: Secondary | ICD-10-CM

## 2017-12-24 DIAGNOSIS — M25552 Pain in left hip: Secondary | ICD-10-CM | POA: Diagnosis not present

## 2017-12-24 MED ORDER — KETOROLAC TROMETHAMINE 60 MG/2ML IM SOLN
30.0000 mg | Freq: Once | INTRAMUSCULAR | Status: AC
  Administered 2017-12-24: 30 mg via INTRAMUSCULAR

## 2017-12-24 MED ORDER — NAPROXEN 500 MG PO TABS: 500.0000 mg | ORAL_TABLET | Freq: Two times a day (BID) | ORAL | 0 refills | Status: DC

## 2017-12-24 MED ORDER — METAXALONE 800 MG PO TABS: 800.0000 mg | ORAL_TABLET | Freq: Three times a day (TID) | ORAL | 0 refills | Status: DC

## 2017-12-24 NOTE — ED Triage Notes (Signed)
Patient states that she fell in her living room 2 weeks ago.  Patient c/o ongoing lower back pain and left leg pain.

## 2017-12-24 NOTE — ED Provider Notes (Signed)
[facility]    CSN: [id number] Arrival date & time: 12/24/17  1119     History   Chief Complaint Chief Complaint  Patient presents with  . Back Pain  . Leg Pain    left    HPI Kayla Love is a 67 y.o. female.   HPI  67 year old female states that she tripped and fell on a rug at home in her living room 2 weeks ago.  She states she landed on her left side mostly on her hip and knee.  Her knee has improved but now she is continued to have worsening back pain and hip pain on the left with radiation of her anterior thigh.  It causes her to limp.  She is able to tolerate the pain when she is just sitting in quiet but when she gets up to move the pain is worse.  She has had no incontinence.  Tried taking meloxicam but this is not been helping        History reviewed. No pertinent past medical history.  There are no active problems to display for this patient.   Past Surgical History:  Procedure Laterality Date  . CHOLECYSTECTOMY      OB History    No data available       Home Medications    Prior to Admission medications   Medication Sig Start Date End Date Taking? Authorizing Provider  cetirizine (ZYRTEC) 10 MG tablet Take 10 mg by mouth daily.   Yes [provider]  meloxicam (MOBIC) 15 MG tablet Take 15 mg by mouth daily. 12/01/16  Yes [provider]  metFORMIN (GLUCOPHAGE) 500 MG tablet Take 500 mg by mouth 2 (two) times daily with a meal.   Yes [provider]  metaxalone (SKELAXIN) 800 MG tablet Take 1 tablet (800 mg total) by mouth 3 (three) times daily. 12/24/17   Lorin Picket, PA-C  naproxen (NAPROSYN) 500 MG tablet Take 1 tablet (500 mg total) by mouth 2 (two) times daily with a meal. 12/24/17   Lorin Picket, PA-C    Family History History reviewed. No pertinent family history.  Social History Social History   Tobacco Use  . Smoking status: Never Smoker  . Smokeless tobacco: Never Used  Substance Use  Topics  . Alcohol use: No  . Drug use: Not on file     Allergies   Codeine and Shellfish allergy   Review of Systems Review of Systems  Constitutional: Positive for activity change. Negative for chills, fatigue and fever.  Musculoskeletal: Positive for back pain and gait problem.  All other systems reviewed and are negative.    Physical Exam Triage Vital Signs ED Triage Vitals  Enc Vitals Group     BP 12/24/17 1207 135/74     Pulse Rate 12/24/17 1207 85     Resp 12/24/17 1207 16     Temp 12/24/17 1207 98.2 F (36.8 C)     Temp Source 12/24/17 1207 Oral     SpO2 12/24/17 1207 98 %     Weight 12/24/17 1203 207 lb (93.9 kg)     Height 12/24/17 1203 5\' 5"  (1.651 m)     Head Circumference --      Peak Flow --      Pain Score 12/24/17 1203 4     Pain Loc --      Pain Edu? --      Excl. in [place]? --    No data found.  Updated Vital Signs BP 135/74 (BP Location: Left Arm)   Pulse 85   Temp 98.2 F (36.8 C) (Oral)   Resp 16   Ht 5\' 5"  (1.651 m)   Wt 207 lb (93.9 kg)   SpO2 98%   BMI 34.45 kg/m   Visual Acuity Right Eye Distance:   Left Eye Distance:   Bilateral Distance:    Right Eye Near:   Left Eye Near:    Bilateral Near:     Physical Exam  Constitutional: She is oriented to person, place, and time. She appears well-developed and well-nourished. No distress.  HENT:  Head: Normocephalic.  Eyes: Pupils are equal, round, and reactive to light. Right eye exhibits no discharge. Left eye exhibits no discharge.  Neck: Normal range of motion.  Musculoskeletal: She exhibits tenderness.  Emanation was performed with Maudie Mercury, RN as Education officer, museum.  Stands with her left hip unloaded in her toes on the floor.  Is a antalgic gait on the left in ambulation.  Tenderness to palpation of the posterior and lateral left hip in stance.  Also has tenderness in the sciatic notch as well as the lower lumbar left paraspinous muscles.  Flexion to the left reproduces her pain.   The right is comfortable as is forward flexion and extension.  Gentle external rotation of the hip sitting position is mild pain.  Straight leg raise testing on the left is positive at 90 degrees with posterior O hip pain.  DTRs are 2+/4 and symmetrical.  EHL peroneal and anterior tibialis muscles are strong to testing.  Sensation is intact throughout.  Flexion on the left is at 100 degrees with pain on the right to 125 degrees.  Internal and external rotation is limited by discomfort on the left and is full on the right.  Neurological: She is alert and oriented to person, place, and time.  Skin: Skin is warm and dry. She is not diaphoretic.  Psychiatric: She has a normal mood and affect. Her behavior is normal. Judgment and thought content normal.  Nursing note and vitals reviewed.    UC Treatments / Results  Labs (all labs ordered are listed, but only abnormal results are displayed) Labs Reviewed - No data to display  EKG  EKG Interpretation None       Radiology Dg Hip Unilat With Pelvis 2-3 Views Left  Result Date: 12/24/2017 CLINICAL DATA:  Left hip pain after fall 2 weeks ago. EXAM: DG HIP (WITH OR WITHOUT PELVIS) 2-3V LEFT COMPARISON:  None. FINDINGS: Negative for hip dislocation. No visible fracture. No evidence of pelvic ring fracture and no diastasis. No detected degenerative hip narrowing. There is lower lumbar facet degenerative spurring. IMPRESSION: No acute or posttraumatic finding. Electronically Signed   By: Monte Fantasia M.D.   On: 12/24/2017 13:43    Procedures Procedures (including critical care time)  Medications Ordered in UC Medications  ketorolac (TORADOL) injection 30 mg (30 mg Intramuscular Given 12/24/17 1317)     Initial Impression / Assessment and Plan / UC Course  I have reviewed the triage vital signs and the nursing notes.  Pertinent labs & imaging results that were available during my care of the patient were reviewed by me and considered in my  medical decision making (see chart for details).     Plan: 1. Test/x-ray results and diagnosis reviewed with patient 2. rx as per orders; risks, benefits, potential side effects reviewed with patient 3. Recommend supportive treatment with and symptom avoidance.  Recommend  ice or heat to the area 20 minutes out of every 2 hours 4-5 times daily.  Use crutch or cane in the opposite hand to help unload the hip for better ambulation. Follow Up with primary care physician if you are not improving 4. F/u prn if symptoms worsen or don't improve   Final Clinical Impressions(s) / UC Diagnoses   Final diagnoses:  Acute pain of left hip  Strain of lumbar region, initial encounter  Contusion of left hip, initial encounter    ED Discharge Orders        Ordered    naproxen (NAPROSYN) 500 MG tablet  2 times daily with meals     12/24/17 1358    metaxalone (SKELAXIN) 800 MG tablet  3 times daily     12/24/17 1358       Controlled Substance Prescriptions [place] Controlled Substance Registry consulted? Not Applicable   Lorin Picket, PA-C 12/24/17 1401

## 2017-12-24 NOTE — Discharge Instructions (Signed)
Heat or ice to your left hip 20 minutes out of every 2 hours 4-5 times daily for pain.  Consider using a crutch or a cane in her opposite hand to help with your ambulation.

## 2017-12-29 ENCOUNTER — Emergency Department
Admission: EM | Admit: 2017-12-29 | Discharge: 2017-12-29 | Disposition: A | Discharge status: Home / Self Care | Payer: BLUE CROSS/BLUE SHIELD | Attending: Emergency Medicine | Admitting: Emergency Medicine

## 2017-12-29 ENCOUNTER — Emergency Department: Payer: BLUE CROSS/BLUE SHIELD

## 2017-12-29 ENCOUNTER — Other Ambulatory Visit: Payer: Self-pay

## 2017-12-29 DIAGNOSIS — M25552 Pain in left hip: Secondary | ICD-10-CM | POA: Insufficient documentation

## 2017-12-29 DIAGNOSIS — G8929 Other chronic pain: Secondary | ICD-10-CM | POA: Diagnosis not present

## 2017-12-29 DIAGNOSIS — Z7984 Long term (current) use of oral hypoglycemic drugs: Secondary | ICD-10-CM | POA: Diagnosis not present

## 2017-12-29 MED ORDER — LIDOCAINE 5 % EX PTCH: 1.0000 | MEDICATED_PATCH | Freq: Two times a day (BID) | CUTANEOUS | 0 refills | Status: AC

## 2017-12-29 NOTE — ED Notes (Signed)
Patient transported to CT 

## 2017-12-29 NOTE — ED Notes (Signed)
ED Provider at bedside. 

## 2017-12-29 NOTE — ED Notes (Signed)
Reviewed discharge instructions, follow-up care, and prescriptions with patient. Patient verbalized understanding of all information reviewed. Patient stable, with no distress noted at this time.    

## 2017-12-29 NOTE — ED Triage Notes (Signed)
Pt ambulatory to triage with no difficulty. Pt reports she fell a few weeks ago and since has been having pain to her lower back and left hip and leg. Pt reports seen at urgent care a few days ago and the medications is not helping.

## 2017-12-29 NOTE — Discharge Instructions (Signed)
Your workup in the Emergency Department today was reassuring.  We did not find any specific abnormalities.  We recommend you drink plenty of fluids, take your regular medications and/or any new ones prescribed today, and follow up with the doctor(s) listed in these documents as recommended.  Return to the Emergency Department if you develop new or worsening symptoms that concern you.  

## 2017-12-29 NOTE — ED Provider Notes (Signed)
[facility] Emergency Department Provider Note  ____________________________________________   First MD Initiated Contact with Patient 12/29/17 [phone]     (approximate)  I have reviewed the triage vital signs and the nursing notes.   HISTORY  Chief Complaint Fall and Leg Pain    HPI Kayla Love is a 67 y.o. female With no tributary past medical history who presents for persistent pain in her left hip and the left side of her pelvis.  She had a fall about 3 weeks ago and has had persistent pain since that time.  She was seen at the urgent care about 5 days ago and had a thorough evaluation and radiographs which did not demonstrate any bony injury.  She was started on muscle relaxer and NSAID but she says is not helping.  She says she can only walk a short distance at a time before she must stop because of the pain in her left hip.  Nothing in particular makes it better and ambulation and weightbearing makes it worse.  She denies any other injuries.  She reports that the pain radiates down from her pelvis into her upper leg.  She has no numbness nor tingling.  No saddle anesthesia.  No bowel or bladder incontinence or retention.  She denies chest pain, shortness of breath, nausea, vomiting, and abdominal pain.  She has not yet had the opportunity to follow-up with primary care or orthopedics.  No past medical history on file.  There are no active problems to display for this patient.   Past Surgical History:  Procedure Laterality Date  . CHOLECYSTECTOMY      Prior to Admission medications   Medication Sig Start Date End Date Taking? Authorizing Provider  cetirizine (ZYRTEC) 10 MG tablet Take 10 mg by mouth daily.    [provider]  lidocaine (LIDODERM) 5 % Place 1 patch onto the skin every 12 (twelve) hours. Remove & Discard patch within 12 hours or as directed by MD.  Pers[place] Proud the patch off for 12 hours before applying a new one. 12/29/17 12/29/18   Hinda Kehr, MD  meloxicam (MOBIC) 15 MG tablet Take 15 mg by mouth daily. 12/01/16   [provider]  metaxalone (SKELAXIN) 800 MG tablet Take 1 tablet (800 mg total) by mouth 3 (three) times daily. 12/24/17   Lorin Picket, PA-C  metFORMIN (GLUCOPHAGE) 500 MG tablet Take 500 mg by mouth 2 (two) times daily with a meal.    [provider]  naproxen (NAPROSYN) 500 MG tablet Take 1 tablet (500 mg total) by mouth 2 (two) times daily with a meal. 12/24/17   Lorin Picket, PA-C    Allergies Codeine and Shellfish allergy  No family history on file.  Social History Social History   Tobacco Use  . Smoking status: Never Smoker  . Smokeless tobacco: Never Used  Substance Use Topics  . Alcohol use: No  . Drug use: Not on file    Review of Systems Constitutional: No fever/chills Cardiovascular: Denies chest pain. Respiratory: Denies shortness of breath. Gastrointestinal: No abdominal pain.  No nausea, no vomiting.  No diarrhea.  No constipation. Genitourinary: Negative for dysuria. Musculoskeletal: Pain for 3 weeks in the left hip and pelvis after a fall, worse with ambulation and weightbearing Integumentary: Negative for rash. Neurological: Negative for headaches, focal weakness or numbness.   ____________________________________________   PHYSICAL EXAM:  VITAL SIGNS: ED Triage Vitals  Enc Vitals Group     BP 12/29/17 0041 Marland Kitchen)  147/89     Pulse Rate 12/29/17 0041 (!) 106     Resp 12/29/17 0041 18     Temp 12/29/17 0041 98.3 F (36.8 C)     Temp Source 12/29/17 0041 Oral     SpO2 12/29/17 0041 96 %     Weight 12/29/17 0043 93.9 kg (207 lb)     Height 12/29/17 0043 1.651 m (5\' 5" )     Head Circumference --      Peak Flow --      Pain Score 12/29/17 0043 10     Pain Loc --      Pain Edu? --      Excl. in [place]? --     Constitutional: Alert and oriented. Well appearing and in no acute distress. Eyes: Conjunctivae are normal.  Head: Atraumatic. Neck:  No stridor.  No meningeal signs.   Cardiovascular: Normal rate, regular rhythm. Good peripheral circulation. Grossly normal heart sounds. Respiratory: Normal respiratory effort.  No retractions. Lungs CTAB. Gastrointestinal: Soft and nontender. No distention.  Musculoskeletal: I am able to abduct passively the patient's left hip and it does not seem to reproduce pain.  She has pain with weightbearing, however, and she indicates a line along the left groin.  She does have some pain with flexion extension of the hip.  No gross deformities are appreciated or palpated.  She ambulates with a limp but is able to bear weight even though it does  Reproduce her discomfort. Neurologic:  Normal speech and language. No gross focal neurologic deficits are appreciated.  Skin:  Skin is warm, dry and intact. No rash noted. Psychiatric: Mood and affect are normal. Speech and behavior are normal.  ____________________________________________   LABS (all labs ordered are listed, but only abnormal results are displayed)  Labs Reviewed - No data to display ____________________________________________  EKG  None - EKG not ordered by ED physician ____________________________________________  RADIOLOGY  ED MD interpretation:  No acute/emergent bony abnormalities  Official radiology report(s): Ct Pelvis Wo Contrast  Result Date: 12/29/2017 CLINICAL DATA:  Initial evaluation for persistent low back pain with left hip and leg pain status post recent fall. EXAM: CT PELVIS WITHOUT CONTRAST TECHNIQUE: Multidetector CT imaging of the pelvis was performed following the standard protocol without intravenous contrast. COMPARISON:  Prior radiographs from 12/24/2017. FINDINGS: Urinary Tract: Visualized distal ureters within normal limits without hydroureter. No radiopaque calculi identified. Partially distended bladder without acute abnormality. Bowel: Visualized bowels within normal limits without obstruction or acute  inflammation. Visualized appendix is normal. Vascular/Lymphatic: Mild bilateral iliac atherosclerotic changes. No adenopathy. Reproductive:  Uterus and ovaries within normal limits. Other: No free air or fluid. No pelvic hematoma. Small fat containing paraumbilical hernia noted. Musculoskeletal: External soft tissues within normal limits. No acute fracture identified. Bony pelvis intact. No acetabular fracture. SI joints approximated and symmetric. Sacrum and coccyx intact. Visualized lower lumbar spine demonstrates no acute abnormality. Femoral heads in normal alignment within the acetabula. Femoral head height preserved. No acute hip fracture. Bilateral facet arthropathy noted within the lower lumbar spine at L4-5 at L5-S1. No discrete lytic or blastic osseous lesions. IMPRESSION: 1. No acute fracture or other traumatic injury within the pelvis. 2. 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 Electronically Signed   By: Jeannine Boga M.D.   On: 12/29/2017 04:04    ____________________________________________   PROCEDURES  Critical Care performed: No   Procedure(s) performed:   Procedures   ____________________________________________   INITIAL IMPRESSION / ASSESSMENT AND  PLAN / ED COURSE  As part of my medical decision making, I reviewed the following data within the [place] notes reviewed and incorporated, Old chart reviewed and Notes from prior ED visits and reviewed [place] controlled substance database.    Differential diagnosis includes, but is not limited to, persistent muscle strain/sprain, subtle fracture not visualized on radiographs, nerve impingement, dislocation.  The injury occurred 3 weeks ago and she has been having pain since that time.  She is ambulatory even though it does reproduce pain.  I explained to her that I am unlikely to be able to diagnose or fix the problem tonight in the emergency department, but  I will obtain a CT pelvis without contrast to rule out any bony abnormalities not visible on radiographs obtained several days ago.  If the CT scan is reassuring she will need to follow-up with Dr. Sharlet Salina or an orthopedic surgeon.  She states that she understands and agrees with the plan.  I reviewed the patient's prescription history over the last 24 months in the multi-state controlled substances database(s) that includes [place], [place], [place], [place], [place], [place], [place], [place], [place], [place], [place], [place], [place], [place], and [place].  The patient has filled no controlled substances during that time.   Clinical Course as of Dec 29 429  Sat Dec 29, 2017  4536 CT scan shows no sign of any bony abnormalities or acute/emergent medical conditions.  Patient is resting comfortably.  I gave her the update and she says she understands.  I will give her a prescription for Lidoderm patches since she is able to point out an area of maximal tenderness in her left hip and it may be helpful for her.  I encouraged close follow-up with Dr. Sharlet Salina.  I gave my usual and customary return precautions.  She understands and agrees with the plan.  [CF]    Clinical Course User Index [CF] Hinda Kehr, MD    ____________________________________________  FINAL CLINICAL IMPRESSION(S) / ED DIAGNOSES  Final diagnoses:  Chronic left hip pain     MEDICATIONS GIVEN DURING THIS VISIT:  Medications - No data to display   ED Discharge Orders        Ordered    lidocaine (LIDODERM) 5 %  Every 12 hours     12/29/17 0430       Note:  This document was prepared using Dragon voice recognition software and may include unintentional dictation errors.    Hinda Kehr, MD 12/29/17 [id number]

## 2018-05-02 ENCOUNTER — Other Ambulatory Visit: Payer: Self-pay | Admitting: Nurse Practitioner

## 2018-05-02 DIAGNOSIS — Z1231 Encounter for screening mammogram for malignant neoplasm of breast: Secondary | ICD-10-CM

## 2018-05-02 DIAGNOSIS — Z13820 Encounter for screening for osteoporosis: Secondary | ICD-10-CM

## 2018-05-30 ENCOUNTER — Ambulatory Visit
Admission: RE | Admit: 2018-05-30 | Discharge: 2018-05-30 | Disposition: A | Discharge status: Home / Self Care | Payer: Medicare Other | Source: Ambulatory Visit | Attending: Nurse Practitioner | Admitting: Nurse Practitioner

## 2018-05-30 DIAGNOSIS — Z1231 Encounter for screening mammogram for malignant neoplasm of breast: Secondary | ICD-10-CM

## 2018-05-30 DIAGNOSIS — Z780 Asymptomatic menopausal state: Secondary | ICD-10-CM | POA: Diagnosis not present

## 2018-05-30 DIAGNOSIS — Z13820 Encounter for screening for osteoporosis: Secondary | ICD-10-CM

## 2019-01-14 IMAGING — CT CT PELVIS W/O CM
2 of 4 series · 15 of 46 positions shown, 17 images · non-contrast
Comparison: Prior radiographs from 12/24/2017.

CLINICAL DATA: Initial evaluation for persistent low back pain with
left hip and leg pain status post recent fall.

EXAM:
CT PELVIS WITHOUT CONTRAST
TECHNIQUE: Multidetector CT imaging of the pelvis was performed following the
standard protocol without intravenous contrast.

[Series 3: axial st · axial · 0.72mm/px · z∈[-1054,-864]mm · 12 of 109 slices shown, 14 images]
[im 7/109  soft-tissue]
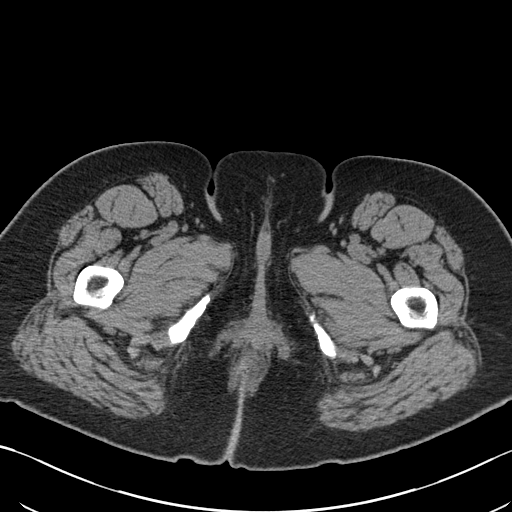
[im 7/109  bone]
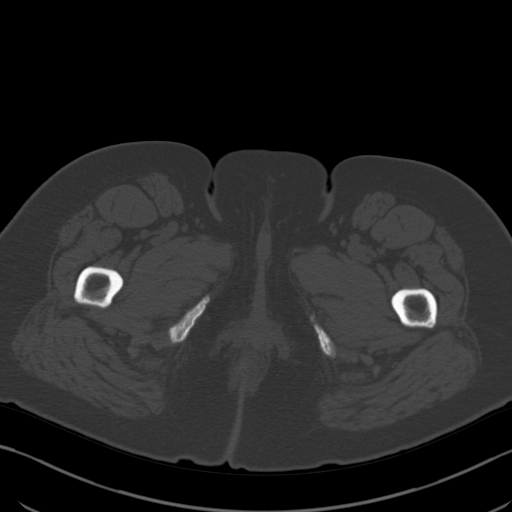
[im 14/109  soft-tissue]
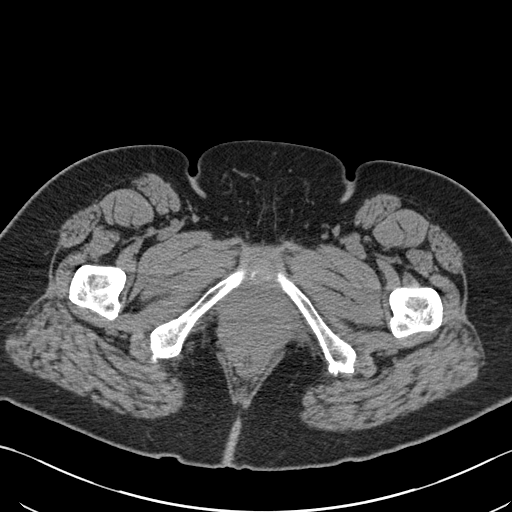
[im 25/109  soft-tissue]
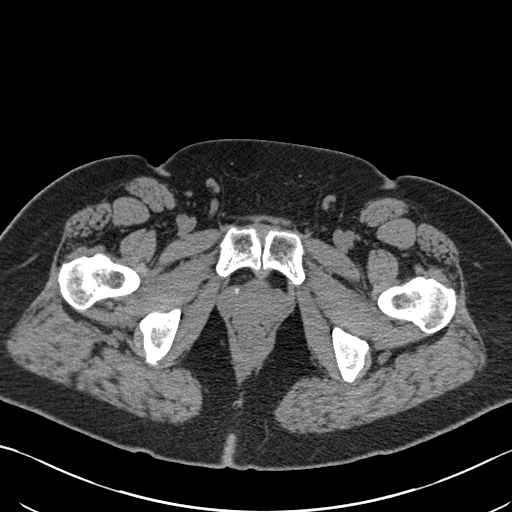
[im 32/109  soft-tissue]
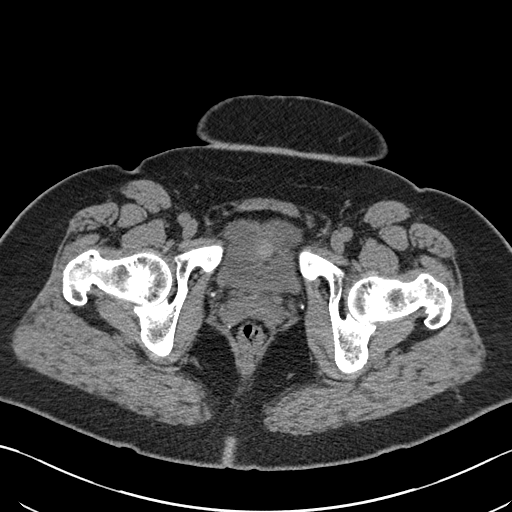
[im 42/109  soft-tissue]
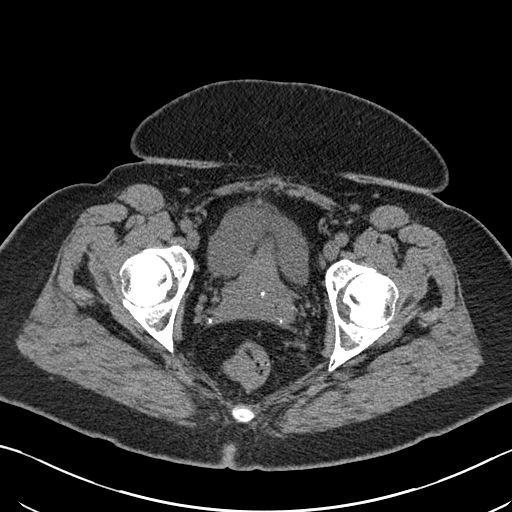
[im 49/109  soft-tissue]
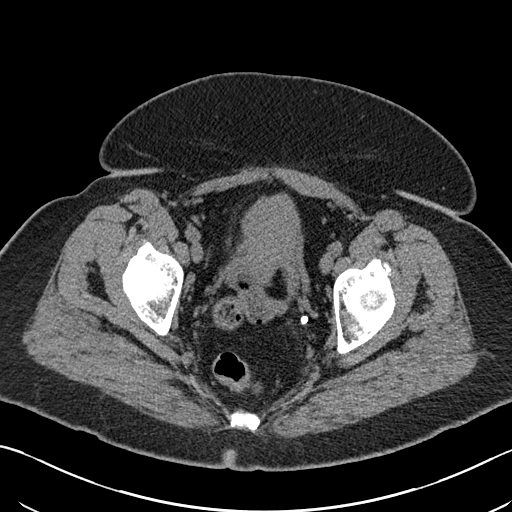
[im 60/109  soft-tissue]
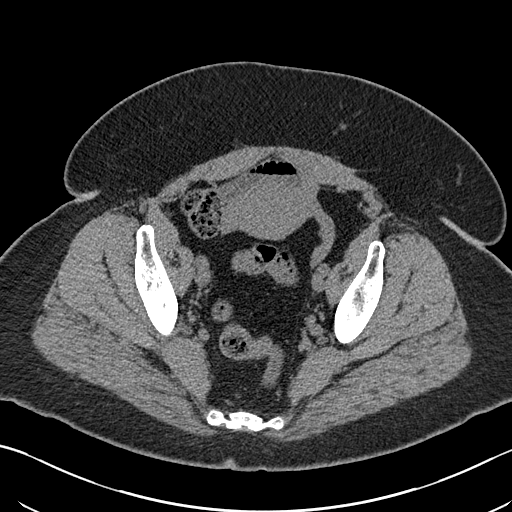
[im 67/109  soft-tissue]
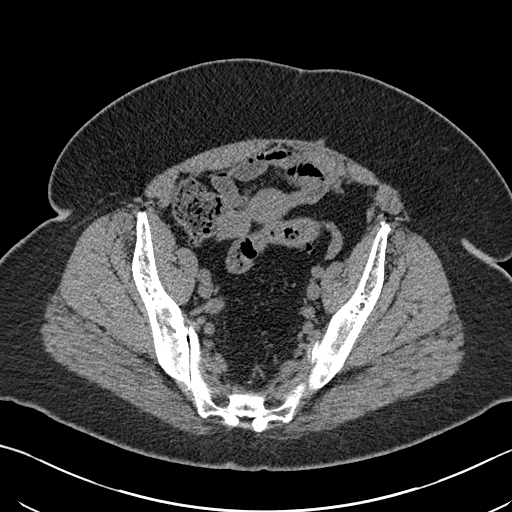
[im 77/109  soft-tissue]
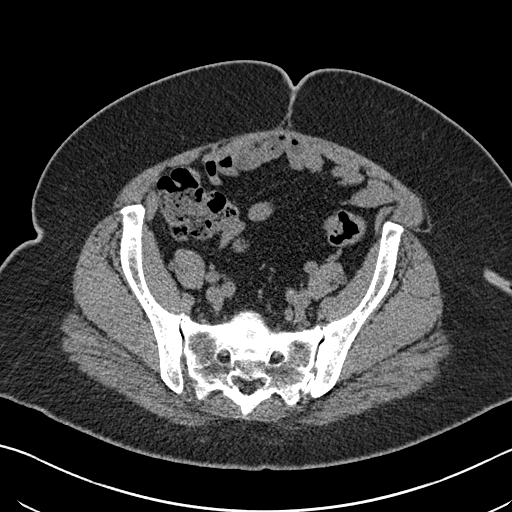
[im 77/109  bone]
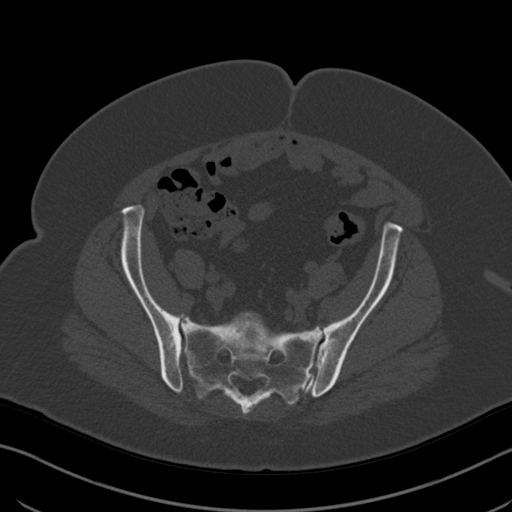
[im 84/109  soft-tissue]
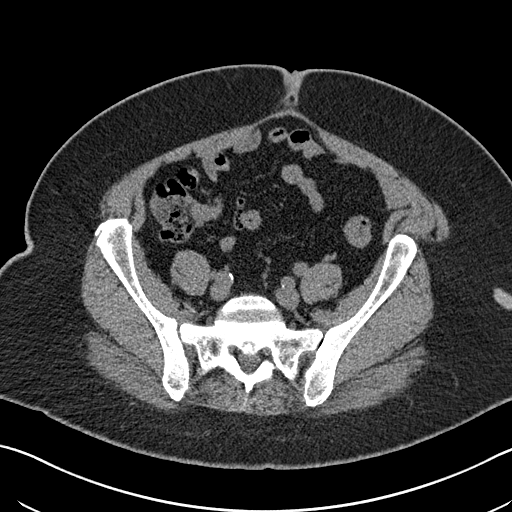
[im 95/109  soft-tissue]
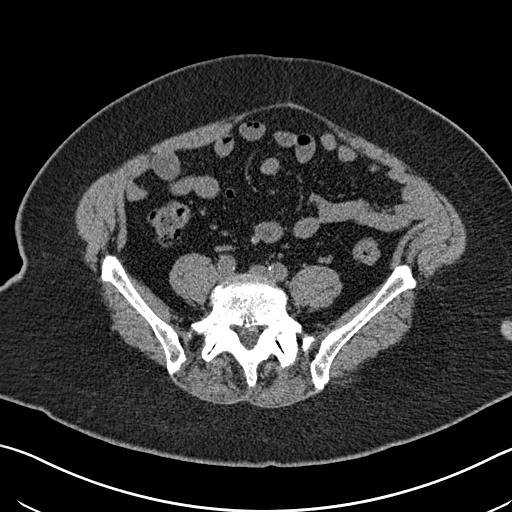
[im 102/109  soft-tissue]
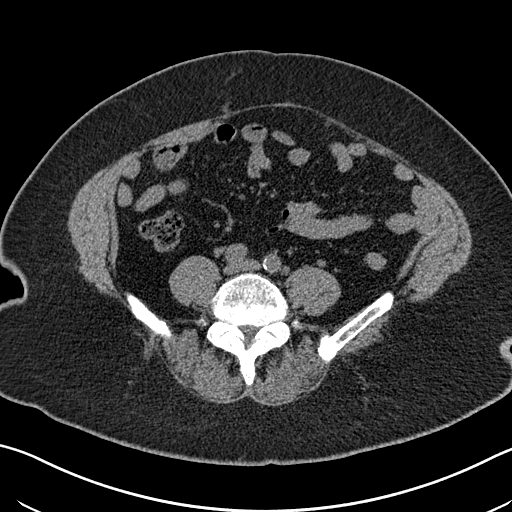

[Series 8: coronal st · coronal · 0.48mm/px · 3 of 118 slices shown]
[im 40/118  soft-tissue]
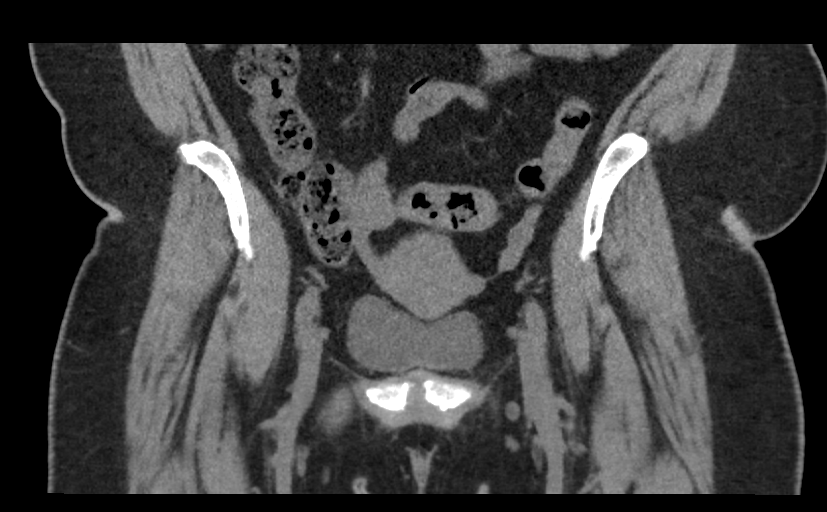
[im 53/118  soft-tissue]
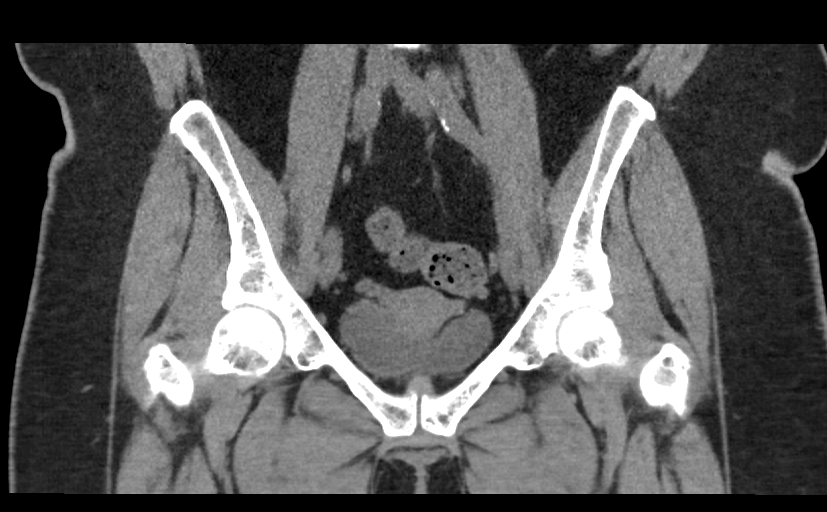
[im 66/118  soft-tissue]
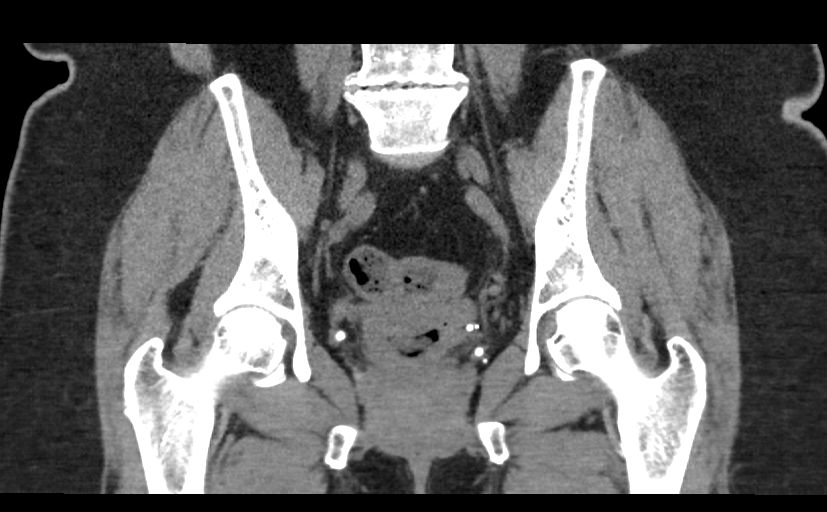

[15 of 46 positions shown; findings below may reference images not displayed]

FINDINGS: Urinary Tract: Visualized distal ureters within normal limits
without hydroureter. No radiopaque calculi identified. Partially
distended bladder without acute abnormality.

Bowel: Visualized bowels within normal limits without obstruction or
acute inflammation. Visualized appendix is normal.

Vascular/Lymphatic: Mild bilateral iliac atherosclerotic changes. No
adenopathy.

Reproductive:  Uterus and ovaries within normal limits.

Other: No free air or fluid. No pelvic hematoma. Small fat
containing paraumbilical hernia noted.

Musculoskeletal: External soft tissues within normal limits. No
acute fracture identified. Bony pelvis intact. No acetabular
fracture. SI joints approximated and symmetric. Sacrum and coccyx
intact. Visualized lower lumbar spine demonstrates no acute
abnormality. Femoral heads in normal alignment within the acetabula.
Femoral head height preserved. No acute hip fracture.

Bilateral facet arthropathy noted within the lower lumbar spine at
L4-5 at L5-S1. No discrete lytic or blastic osseous lesions.
IMPRESSION: 1. No acute fracture or other traumatic injury within the pelvis.
2. Advanced bilateral facet arthropathy within the lower lumbar
spine.

## 2019-09-05 ENCOUNTER — Other Ambulatory Visit: Payer: Self-pay | Admitting: Family Medicine

## 2019-09-05 DIAGNOSIS — Z1231 Encounter for screening mammogram for malignant neoplasm of breast: Secondary | ICD-10-CM

## 2019-10-31 ENCOUNTER — Other Ambulatory Visit: Payer: Self-pay | Admitting: Student

## 2019-10-31 DIAGNOSIS — R059 Cough, unspecified: Secondary | ICD-10-CM

## 2019-10-31 DIAGNOSIS — R1312 Dysphagia, oropharyngeal phase: Secondary | ICD-10-CM

## 2019-10-31 DIAGNOSIS — R05 Cough: Secondary | ICD-10-CM

## 2019-11-04 HISTORY — PX: COLONOSCOPY: SHX174

## 2019-11-11 ENCOUNTER — Ambulatory Visit
Admission: RE | Admit: 2019-11-11 | Discharge: 2019-11-11 | Disposition: A | Discharge status: Home / Self Care | Payer: Medicare Other | Source: Ambulatory Visit | Attending: Student | Admitting: Student

## 2019-11-11 ENCOUNTER — Other Ambulatory Visit: Payer: Self-pay

## 2019-11-11 DIAGNOSIS — R059 Cough, unspecified: Secondary | ICD-10-CM

## 2019-11-11 DIAGNOSIS — R1312 Dysphagia, oropharyngeal phase: Secondary | ICD-10-CM | POA: Insufficient documentation

## 2019-11-11 DIAGNOSIS — R05 Cough: Secondary | ICD-10-CM | POA: Insufficient documentation

## 2019-11-11 NOTE — Therapy (Signed)
[place], [place], [address] Phone: [phone]   Fax:     Modified Barium Swallow  Patient Details  Name: Kayla Love MRN: [id number] Date of Birth: 04-13-1951 No data recorded  Encounter Date: 11/11/2019  End of Session - 11/11/19 1322    Visit Number  1    Number of Visits  1    Date for SLP Re-Evaluation  11/11/19       No past medical history on file.  Past Surgical History:  Procedure Laterality Date  . CHOLECYSTECTOMY      There were no vitals filed for this visit.   Subjective: Patient behavior: (alertness, ability to follow instructions, etc.): The patient is alert, able to express her swallowing concerns, and follow directions.   Chief complaint: Patient reports choking on her saliva, drinking liquids, and awaking from sleep choking/coughing   Objective:  Radiological Procedure: A videoflouroscopic evaluation of oral-preparatory, reflex initiation, and pharyngeal phases of the swallow was performed; as well as a screening of the upper esophageal phase.  I. POSTURE: Upright in MBS chair  II. VIEW: Lateral  III. COMPENSATORY STRATEGIES: N/A  IV. BOLUSES ADMINISTERED:   Thin Liquid: 1 small, 3 rapid consecutive   Nectar-thick Liquid: 1 moderate   Honey-thick Liquid: DNT   Puree: 2 teaspoon presentations   Mechanical Soft: 1/4 graham cracker in applesauce  V. RESULTS OF EVALUATION: A. ORAL PREPARATORY PHASE: (The lips, tongue, and velum are observed for strength and coordination)       **Overall Severity Rating: Mild; oral holding and piecemeal swallowing  B. SWALLOW INITIATION/REFLEX: (The reflex is normal if "triggered" by the time the bolus reached the base of the tongue)  **Overall Severity Rating: Mild-moderate; triggers while falling from the valleculae to the pyriform sinuses  C. PHARYNGEAL PHASE: (Pharyngeal function is normal if the bolus shows rapid,  smooth, and continuous transit through the pharynx and there is no pharyngeal residue after the swallow)  **Overall Severity Rating: within normal limits   D. LARYNGEAL PENETRATION: (Material entering into the laryngeal inlet/vestibule but not aspirated) None  E. ASPIRATION: None  F. ESOPHAGEAL PHASE: (Screening of the upper esophagus) An esophageal scan showed mid-esophageal stasis  ASSESSMENT: This 69 year old woman; with choking on saliva, drinking liquids, and awaking in the night; is presenting with mild oropharyngeal dysphagia characterized by oral hold/piecemeal swallowing and delayed pharyngeal swallow initiation.  With exception of oral holding and piecemeal swallowing of large boluses, oral control of the bolus for anterior to posterior transfer is within functional limits.   Aspects of the pharyngeal stage of swallowing including tongue base retraction, hyolaryngeal excursion, epiglottic inversion, and duration/amplitude of UES opening are within normal limits.  There is no observed pharyngeal residue, laryngeal penetration, or tracheal aspiration.  Aspiration does not appear to be the cause of choking or coughing.  In the absence of neurological diagnosis, oral holding/piecemeal swallowing is typically due to fear that it will be problematic to swallow rather than neuromuscular dysphagia.  Delayed pharyngeal swallow initiation is consistent with effects of laryngopharyngeal reflux (inflammation, edema, and resultant decreased sensation of the larynx and pharynx).    PLAN/RECOMMENDATIONS:   A. Diet: Usual diet   B. Swallowing Precautions: Reflux precautions   C. Recommended consultation to: follow up with GI as recommended   D. Therapy recommendations: speech therapy is not indicated   E. Results and recommendations were discussed with the patient immediately following the study and the final report  routed to the referring practitioner.      Oropharyngeal dysphagia - Plan: DG OP  Swallowing Func-Medicare/Speech Path, DG OP Swallowing Func-Medicare/Speech Path  Cough - Plan: DG OP Swallowing Func-Medicare/Speech Path, DG OP Swallowing Func-Medicare/Speech Path        Problem List There are no problems to display for this patient.  Leroy Sea, MS/CCC- SLP  Lou Miner 11/11/2019, Edson Snowball PM  [facility] [place], [place], [address] Phone: [phone]   Fax:     Name: VEONICA TIERCE MRN: [id number] Date of Birth: 1951/07/11

## 2019-12-31 ENCOUNTER — Other Ambulatory Visit: Payer: Self-pay | Admitting: Family Medicine

## 2019-12-31 DIAGNOSIS — Z1231 Encounter for screening mammogram for malignant neoplasm of breast: Secondary | ICD-10-CM

## 2020-01-27 ENCOUNTER — Emergency Department
Admission: EM | Admit: 2020-01-27 | Discharge: 2020-01-27 | Disposition: A | Discharge status: Home / Self Care | Payer: Medicare Other | Attending: Emergency Medicine | Admitting: Emergency Medicine

## 2020-01-27 ENCOUNTER — Other Ambulatory Visit: Payer: Self-pay

## 2020-01-27 DIAGNOSIS — M62838 Other muscle spasm: Secondary | ICD-10-CM | POA: Diagnosis not present

## 2020-01-27 DIAGNOSIS — E119 Type 2 diabetes mellitus without complications: Secondary | ICD-10-CM | POA: Diagnosis not present

## 2020-01-27 DIAGNOSIS — Z79899 Other long term (current) drug therapy: Secondary | ICD-10-CM | POA: Insufficient documentation

## 2020-01-27 DIAGNOSIS — M79601 Pain in right arm: Secondary | ICD-10-CM

## 2020-01-27 DIAGNOSIS — Z7984 Long term (current) use of oral hypoglycemic drugs: Secondary | ICD-10-CM | POA: Insufficient documentation

## 2020-01-27 LAB — CBC
HCT: 37.5 % (ref 36.0–46.0)
Hemoglobin: 12.4 g/dL (ref 12.0–15.0)
MCH: 30.1 pg (ref 26.0–34.0)
MCHC: 33.1 g/dL (ref 30.0–36.0)
MCV: 91 fL (ref 80.0–100.0)
Platelets: 315 10*3/uL (ref 150–400)
RBC: 4.12 MIL/uL (ref 3.87–5.11)
RDW: 12.9 % (ref 11.5–15.5)
WBC: 8 10*3/uL (ref 4.0–10.5)
nRBC: 0 % (ref 0.0–0.2)

## 2020-01-27 LAB — BASIC METABOLIC PANEL
Anion gap: 11 (ref 5–15)
BUN: 16 mg/dL (ref 8–23)
CO2: 26 mmol/L (ref 22–32)
Calcium: 9.1 mg/dL (ref 8.9–10.3)
Chloride: 98 mmol/L (ref 98–111)
Creatinine, Ser: 0.88 mg/dL (ref 0.44–1.00)
GFR calc Af Amer: >60 mL/min (ref >60)
GFR calc non Af Amer: >60 mL/min (ref >60)
Glucose, Bld: 124 mg/dL — ABNORMAL HIGH (ref 70–99)
Potassium: 4.2 mmol/L (ref 3.5–5.1)
Sodium: 135 mmol/L (ref 135–145)

## 2020-01-27 MED ORDER — CYCLOBENZAPRINE HCL 5 MG PO TABS: 5.0000 mg | ORAL_TABLET | Freq: Three times a day (TID) | ORAL | 0 refills | Status: DC | PRN

## 2020-01-27 NOTE — ED Triage Notes (Signed)
Pt c/o muscle spasms to R arm since yesterday. Denies vomiting or diarrhea or feeling dehydrated. A&O, ambulatory.

## 2020-01-27 NOTE — ED Provider Notes (Signed)
[facility] Provider Note   ____________________________________________   First MD Initiated Contact with Patient 01/27/20 1546     (approximate)  I have reviewed the triage vital signs and the nursing notes.   HISTORY  Chief Complaint Spasms    HPI Kayla Love is a 69 y.o. female with past medical history of diabetes and hyperlipidemia who presents to the ED complaining of arm pain.  Patient reports that she developed pain mostly and the upper portion of her right arm yesterday evening.  She describes it as an episodic spasm-like pain that feels like her muscle will ball up and cramp at times.  It has occasionally affected her forearm as well, but she denies any recent trauma or other injury to her arm.  She states she has had similar muscle spasms and cramps in her left leg, but denies any history of DVT or PE.  She has not noticed any rashes or swelling to her arm.  She has otherwise been well with no recent fevers, cough, chest pain, or shortness of breath.        History reviewed. No pertinent past medical history.  There are no problems to display for this patient.   Past Surgical History:  Procedure Laterality Date  . CHOLECYSTECTOMY      Prior to Admission medications   Medication Sig Start Date End Date Taking? Authorizing Provider  cetirizine (ZYRTEC) 10 MG tablet Take 10 mg by mouth daily.    [provider]  cyclobenzaprine (FLEXERIL) 5 MG tablet Take 1 tablet (5 mg total) by mouth 3 (three) times daily as needed for muscle spasms. 01/27/20   Blake Divine, MD  meloxicam (MOBIC) 15 MG tablet Take 15 mg by mouth daily. 12/01/16   [provider]  metaxalone (SKELAXIN) 800 MG tablet Take 1 tablet (800 mg total) by mouth 3 (three) times daily. 12/24/17   Lorin Picket, PA-C  metFORMIN (GLUCOPHAGE) 500 MG tablet Take 500 mg by mouth 2 (two) times daily with a meal.    [provider]    naproxen (NAPROSYN) 500 MG tablet Take 1 tablet (500 mg total) by mouth 2 (two) times daily with a meal. 12/24/17   Lorin Picket, PA-C    Allergies Codeine and Shellfish allergy  History reviewed. No pertinent family history.  Social History Social History   Tobacco Use  . Smoking status: Never Smoker  . Smokeless tobacco: Never Used  Substance Use Topics  . Alcohol use: No  . Drug use: Not on file    Review of Systems  Constitutional: No fever/chills Eyes: No visual changes. ENT: No sore throat. Cardiovascular: Denies chest pain. Respiratory: Denies shortness of breath. Gastrointestinal: No abdominal pain.  No nausea, no vomiting.  No diarrhea.  No constipation. Genitourinary: Negative for dysuria. Musculoskeletal: Negative for back pain.  Positive right arm pain and muscle spasm. Skin: Negative for rash. Neurological: Negative for headaches, focal weakness or numbness.  ____________________________________________   PHYSICAL EXAM:  VITAL SIGNS: ED Triage Vitals  Enc Vitals Group     BP 01/27/20 1436 (!) 147/76     Pulse Rate 01/27/20 1436 77     Resp 01/27/20 1436 17     Temp 01/27/20 1436 98.4 F (36.9 C)     Temp Source 01/27/20 1436 Oral     SpO2 01/27/20 1436 99 %     Weight 01/27/20 1436 210 lb (95.3 kg)     Height 01/27/20 1436 5'  5" (1.651 m)     Head Circumference --      Peak Flow --      Pain Score 01/27/20 1454 10     Pain Loc --      Pain Edu? --      Excl. in [place]? --     Constitutional: Alert and oriented. Eyes: Conjunctivae are normal. Head: Atraumatic. Nose: No congestion/rhinnorhea. Mouth/Throat: Mucous membranes are moist. Neck: Normal ROM Cardiovascular: Normal rate, regular rhythm. Grossly normal heart sounds.  2+ radial pulses bilaterally. Respiratory: Normal respiratory effort.  No retractions. Lungs CTAB. Gastrointestinal: Soft and nontender. No distention. Genitourinary: deferred Musculoskeletal: No lower extremity  tenderness nor edema.  No tenderness, erythema, edema, or warmth of right upper extremity. Neurologic:  Normal speech and language. No gross focal neurologic deficits are appreciated. Skin:  Skin is warm, dry and intact. No rash noted. Psychiatric: Mood and affect are normal. Speech and behavior are normal.  ____________________________________________   LABS (all labs ordered are listed, but only abnormal results are displayed)  Labs Reviewed  BASIC METABOLIC PANEL - Abnormal; Notable for the following components:      Result Value   Glucose, Bld 124 (*)    All other components within normal limits  CBC    PROCEDURES  Procedure(s) performed (including Critical Care):  Procedures   ____________________________________________   INITIAL IMPRESSION / ASSESSMENT AND PLAN / ED COURSE       69 year old female with history of diabetes and hyperlipidemia who presents to the ED complaining of right arm pain and muscle spasm since last night.  She is neurovascularly intact to her bilateral upper extremities with intact perfusion and pulses.  She has no signs or rash or cellulitis.  There is no edema, tenderness, or warmth to suggest DVT.  She does appear to take a statin medication for her hyperlipidemia and I have advised her that this is possibly contributing, which she will discuss with her PCP.  Lab work here is unremarkable and she is appropriate for discharge home, will prescribe short course of muscle relaxants.  I have advised patient to use muscle relaxants only when she is getting ready to go to bed due to possible side effects.  Patient agrees with plan.      ____________________________________________   FINAL CLINICAL IMPRESSION(S) / ED DIAGNOSES  Final diagnoses:  Right arm pain  Muscle spasm     ED Discharge Orders         Ordered    cyclobenzaprine (FLEXERIL) 5 MG tablet  3 times daily PRN     01/27/20 1603           Note:  This document was prepared  using Dragon voice recognition software and may include unintentional dictation errors.   Blake Divine, MD 01/27/20 [phone]

## 2020-04-14 ENCOUNTER — Other Ambulatory Visit: Payer: Self-pay | Admitting: Family Medicine

## 2020-04-14 DIAGNOSIS — Z13820 Encounter for screening for osteoporosis: Secondary | ICD-10-CM

## 2020-06-07 ENCOUNTER — Ambulatory Visit
Admission: RE | Admit: 2020-06-07 | Discharge: 2020-06-07 | Disposition: A | Discharge status: Home / Self Care | Payer: Medicare Other | Source: Ambulatory Visit | Attending: Family Medicine | Admitting: Family Medicine

## 2020-06-07 ENCOUNTER — Other Ambulatory Visit: Payer: Self-pay

## 2020-06-07 DIAGNOSIS — Z1231 Encounter for screening mammogram for malignant neoplasm of breast: Secondary | ICD-10-CM | POA: Insufficient documentation

## 2020-07-21 ENCOUNTER — Ambulatory Visit
Admission: RE | Admit: 2020-07-21 | Discharge: 2020-07-21 | Disposition: A | Discharge status: Home / Self Care | Payer: Medicare Other | Source: Ambulatory Visit | Attending: Family Medicine | Admitting: Family Medicine

## 2020-07-21 DIAGNOSIS — Z780 Asymptomatic menopausal state: Secondary | ICD-10-CM | POA: Insufficient documentation

## 2020-07-21 DIAGNOSIS — Z13820 Encounter for screening for osteoporosis: Secondary | ICD-10-CM | POA: Insufficient documentation

## 2020-07-21 DIAGNOSIS — E119 Type 2 diabetes mellitus without complications: Secondary | ICD-10-CM | POA: Insufficient documentation

## 2021-02-01 ENCOUNTER — Other Ambulatory Visit: Payer: Self-pay

## 2021-02-01 ENCOUNTER — Ambulatory Visit: Payer: Medicare HMO | Attending: Family Medicine

## 2021-02-01 DIAGNOSIS — M542 Cervicalgia: Secondary | ICD-10-CM | POA: Diagnosis present

## 2021-02-01 DIAGNOSIS — M5412 Radiculopathy, cervical region: Secondary | ICD-10-CM | POA: Diagnosis not present

## 2021-02-01 DIAGNOSIS — M79601 Pain in right arm: Secondary | ICD-10-CM | POA: Insufficient documentation

## 2021-02-01 NOTE — Therapy (Signed)
[facility] [address], [place], [address] Phone: [phone]   Fax:  [phone]  Physical Therapy Evaluation  Patient Details  Name: Kayla Love MRN: [id number] Date of Birth: 1951-10-17 Referring Provider (PT): Hendricks Milo, FNP   Encounter Date: 02/01/2021   PT End of Session - 02/01/21 0936    Visit Number 1    Number of Visits 17    Date for PT Re-Evaluation 03/31/21    Authorization Type 1    Authorization Time Period of 10    PT Start Time [id number]    PT Stop Time 1015    PT Time Calculation (min) 39 min    Activity Tolerance Patient limited by pain           No past medical history on file.  Past Surgical History:  Procedure Laterality Date  . CHOLECYSTECTOMY      There were no vitals filed for this visit.    Subjective Assessment - 02/01/21 0939    Subjective R UE: (R lateral neck, upper trap, and tricpes area) 5/10 currently. 10/10 at worst for the past 3 month.    Pertinent History Pain in R arm. Pt has a lot cramps. She would have pain at R triceps, R forearm (C5/6 dermatome) as well as R upper trap area. Pt has has the cramping for about a year or 2. Had one of the worst cramps she has had about 2-3 weeks ago. Pt was just sitting on the couch, watching TV and reached over to the side to pick up her phone to pick it up and her R UE locked up. Has not had and imaging done for her shoulder or neck. Also has cramps B calves which ball up like knots.  L hand dominant but pt uses her R hand most. Pain has been getting worse. Today is the best day she has had so far. Pain gets worse as the day progresses.    Patient Stated Goals Get rid of the pain.    Currently in Pain? Yes    Pain Score 5     Pain Location Arm    Pain Orientation Right    Pain Descriptors / Indicators --   cramps, lock up   Pain Type Chronic pain    Pain Onset More than a month ago    Pain Frequency Occasional     Aggravating Factors  Picking up stuff with R hand, sweeping the floor, sleeping (on her R side).  Turning her head to the R > L; weight bearing on R UE, pushing.    Pain Relieving Factors propping her R UE up with piloows such as when supine or sitting. Massage              [facility] PT Assessment - 02/01/21 0949      Assessment   Medical Diagnosis Pain in R arm    Referring Provider (PT) Hendricks Milo, FNP    Onset Date/Surgical Date 01/25/21    Hand Dominance Left   But also uses her R     Observation/Other Assessments   Focus on Therapeutic Outcomes (FOTO)  Neck FOTO 32      Posture/Postural Control   Posture Comments Forward neck, R lateral shift (cervical), R lumbar lateral shift,      AROM   Right Shoulder Flexion 101 Degrees   with pain   Cervical Flexion limited wiht R lateral neck pain    Cervical  Extension limited with reproduction of symptoms    Cervical - Right Side Bend limited with R lateral neck pain    Cervical - Left Side Bend limited with R lateral neck pull    Cervical - Right Rotation 22   with R lateral neck pain   Cervical - Left Rotation 30   with R lateral neck pain < R rotation     Strength   Right Shoulder Flexion 3-/5    Right Shoulder ABduction 3-/5      Palpation   Palpation comment Muscle tension B upper trap muscles R > L                      Objective measurements completed on examination: See above findings.   No latex allergies No blood pressure problems per pt.      Manual therapy  Seated STM R upper trap mucle to decrease tension  Seated STM L upper trap and rhomboid to decrease tension to lower cervical spine   Therapeutic exercise Gentle R cervical lateral shift correction manual assist with PT. Slight decrease in R lateral neck pain  Seated very gentle L lateral cervical shift isometrics 10x5 seconds   Increased burning sensation R lateral neck.   Pt was recommended to yuse heating pad on L upper trap and neck  for the next day or 2 for 15 min at a time with cloth barrier so its not too hot  Improved exercise technique, movement at target joints, use of target muscles after mod verbal, visual, tactile cues.    Response to treatment Fair tolerance to today's session. Irritable symptoms.    Clinical impression Pt is a 70 year old female who came to physical therapy secondary to chronic R UE pain which has overall worsened for the past few weeks. She also presents with altered posture, B upper trap muscle tension and TTP R upper trap area, limited cervical and R shoulder AROM, R UE weakness, and difficulty performing functional tasks such as reaching as well as looking around due to pain. Pt will benefit from skilled physical therapy services to address the aforementioned deficits.                PT Education - 02/01/21 1234    Education Details ther-ex, plan of care    Person(s) Educated Patient    Methods Explanation;Demonstration;Tactile cues;Verbal cues    Comprehension Returned demonstration;Verbalized understanding            PT Short Term Goals - 02/01/21 1213      PT SHORT TERM GOAL #1   Title Pt will be independent with her initial HEP to decrease pain, improve strength and ability to look around as well as reach with R UE with less pain.    Time 3    Period Weeks    Status New    Target Date 02/24/21             PT Long Term Goals - 02/01/21 1214      PT LONG TERM GOAL #1   Title Pt will have a decrease in R UE pain to 5/10 or less at worst to promote ability to reach with less pain.    Baseline 10/10 R UE pain at most for the past 7 days (02/01/2021)    Time 8    Period Weeks    Status New    Target Date 03/31/21      PT LONG TERM GOAL #2  Title Pt will improve R and L cervical rotation AROM by at least 15 degrees to promote ability to look around as well as reach with R UE with less pain.    Baseline Cervical rotation AROM 22 degrees R, 30 degrees L  (02/01/2021)    Time 8    Period Weeks    Status New    Target Date 03/31/21      PT LONG TERM GOAL #3   Title Pt will improve her cervical FOTO score by at least 10 points as a demonstration of improved function.    Baseline Neck FOTO 32 (02/01/2021)    Time 8    Period Weeks    Status New    Target Date 03/31/21      PT LONG TERM GOAL #4   Title Pt will improve R shoulder flexion AROM to 140 degrees or more to promote ability to reach with less difficulty.    Baseline R shoulder flexion AROM 101 degrees with pain (02/01/2021)    Time 8    Period Weeks    Status New    Target Date 03/31/21                  Plan - 02/01/21 1040    Clinical Impression Statement Pt is a 70 year old female who came to physical therapy secondary to chronic R UE pain which has overall worsened for the past few weeks. She also presents with altered posture, B upper trap muscle tension and TTP R upper trap area, limited cervical and R shoulder AROM, R UE weakness, and difficulty performing functional tasks such as reaching as well as looking around due to pain. Pt will benefit from skilled physical therapy services to address the aforementioned deficits.    Personal Factors and Comorbidities Comorbidity 3+;Age;Fitness;Past/Current Experience;Time since onset of injury/illness/exacerbation    Comorbidities DM, GERD, goiter    Examination-Activity Limitations Hygiene/Grooming;Lift;Caring for Others;Reach Overhead;Carry;Dressing    Stability/Clinical Decision Making Evolving/Moderate complexity   Pain is overall worsening per pt   Clinical Decision Making Moderate    Rehab Potential Fair    PT Frequency 2x / week    PT Duration 8 weeks    PT Treatment/Interventions Therapeutic activities;Therapeutic exercise;Neuromuscular re-education;Patient/family education;Manual techniques;Dry needling;Spinal Manipulations;Joint Manipulations;Electrical Stimulation;Iontophoresis 4mg /ml Dexamethasone;Traction    PT  Next Visit Plan posture, scapular, anterior cervical strengthening, R shoulder ROM, manual techniques, modalities PRN    Consulted and Agree with Plan of Care Patient           Patient will benefit from skilled therapeutic intervention in order to improve the following deficits and impairments:  Pain,Postural dysfunction,Improper body mechanics,Decreased activity tolerance,Decreased range of motion,Decreased strength  Visit Diagnosis: Cervical radiculitis - Plan: PT plan of care cert/re-cert  Cervicalgia - Plan: PT plan of care cert/re-cert  Pain in right arm - Plan: PT plan of care cert/re-cert     Problem List There are no problems to display for this patient.   Joneen Boers PT, DPT   02/01/2021, 12:37 PM  [facility] [address], [place], [address] Phone: [phone]   Fax:  [phone]  Name: SRIHITHA TAGLIAFERRI MRN: [id number] Date of Birth: [birth year]

## 2021-02-08 ENCOUNTER — Ambulatory Visit: Payer: Medicare HMO

## 2021-02-08 ENCOUNTER — Other Ambulatory Visit: Payer: Self-pay

## 2021-02-08 DIAGNOSIS — M5412 Radiculopathy, cervical region: Secondary | ICD-10-CM | POA: Diagnosis not present

## 2021-02-08 DIAGNOSIS — M79601 Pain in right arm: Secondary | ICD-10-CM

## 2021-02-08 DIAGNOSIS — M542 Cervicalgia: Secondary | ICD-10-CM

## 2021-02-08 NOTE — Therapy (Signed)
[facility] [address], [place], [address] Phone: [phone]   Fax:  [phone]  Physical Therapy Treatment  Patient Details  Name: Kayla Love MRN: [id number] Date of Birth: 1951-06-21 Referring Provider (PT): Hendricks Milo, FNP   Encounter Date: 02/08/2021   PT End of Session - 02/08/21 1145    Visit Number 2    Number of Visits 17    Date for PT Re-Evaluation 03/31/21    Authorization Type 2    Authorization Time Period of 10    PT Start Time 1146    PT Stop Time 5176    PT Time Calculation (min) 49 min    Activity Tolerance Patient limited by pain           No past medical history on file.  Past Surgical History:  Procedure Laterality Date  . CHOLECYSTECTOMY      There were no vitals filed for this visit.   Subjective Assessment - 02/08/21 1147    Subjective R neck hurts but not too bad. 4-5/10 currently. Was ok after last session.    Pertinent History Pain in R arm. Pt has a lot cramps. She would have pain at R triceps, R forearm (C5/6 dermatome) as well as R upper trap area. Pt has has the cramping for about a year or 2. Had one of the worst cramps she has had about 2-3 weeks ago. Pt was just sitting on the couch, watching TV and reached over to the side to pick up her phone to pick it up and her R UE locked up. Has not had and imaging done for her shoulder or neck. Also has cramps B calves which ball up like knots.  L hand dominant but pt uses her R hand most. Pain has been getting worse. Today is the best day she has had so far. Pain gets worse as the day progresses.    Patient Stated Goals Get rid of the pain.    Currently in Pain? Yes    Pain Score 5     Pain Onset More than a month ago                                     PT Education - 02/08/21 1253    Education Details ther-ex    Person(s) Educated Patient    Methods Explanation;Demonstration;Tactile  cues;Verbal cues    Comprehension Verbalized understanding;Returned demonstration          Objective  No latex allergies No blood pressure problems per pt.     Manual therapy  Seated STM R upper trap and rhomboid mucles to decrease tension  Seated STM L upper trap and rhomboid muscles to decrease tension  Seated STM posterior lower cervical area to decrease fascial restrictions   Decreased symptoms after aforementioned techniques  Seated STM R distal scalene area to decrease muscle tension  Increased ache afterwards    Therapeutic exercise Seated B scapular retraction with PT max cues and guidance 10x3  Then with 10x5 second holds for 3 sets. Max cues for proper movement.   Decreased R neck pain afterwards  Seated manually resisted scapular retraction targeting lower trap muscles   Very difficult to get proper movement  Assisted L cervical lateral shift to neutral position 10 second holds x 10   Decreased neck pain initially. Symptoms with increased repetition  Improved exercise technique, movement at target joints, use of target muscles after mod to max verbal, visual, tactile cues.    Response to treatment Fair tolerance to today's session.   Clinical impression Better treatment tolerance today compared to previous session. Decreased irritability of symptoms observed. Decreased pain with decreasing upper trap muscle tension and improving lower trap muscle activation. Difficulty with proper scapular mechanics needing max cues to perform properly. Fair tolerance to today's session. Pt will benefit from continued skilled physical therapy services to decrease pain, improve scapular mechanics, strength, and function.       PT Short Term Goals - 02/01/21 1213      PT SHORT TERM GOAL #1   Title Pt will be independent with her initial HEP to decrease pain, improve strength and ability to look around as well as reach with R UE with less pain.    Time 3    Period  Weeks    Status New    Target Date 02/24/21             PT Long Term Goals - 02/01/21 1214      PT LONG TERM GOAL #1   Title Pt will have a decrease in R UE pain to 5/10 or less at worst to promote ability to reach with less pain.    Baseline 10/10 R UE pain at most for the past 7 days (02/01/2021)    Time 8    Period Weeks    Status New    Target Date 03/31/21      PT LONG TERM GOAL #2   Title Pt will improve R and L cervical rotation AROM by at least 15 degrees to promote ability to look around as well as reach with R UE with less pain.    Baseline Cervical rotation AROM 22 degrees R, 30 degrees L (02/01/2021)    Time 8    Period Weeks    Status New    Target Date 03/31/21      PT LONG TERM GOAL #3   Title Pt will improve her cervical FOTO score by at least 10 points as a demonstration of improved function.    Baseline Neck FOTO 32 (02/01/2021)    Time 8    Period Weeks    Status New    Target Date 03/31/21      PT LONG TERM GOAL #4   Title Pt will improve R shoulder flexion AROM to 140 degrees or more to promote ability to reach with less difficulty.    Baseline R shoulder flexion AROM 101 degrees with pain (02/01/2021)    Time 8    Period Weeks    Status New    Target Date 03/31/21                 Plan - 02/08/21 1245    Clinical Impression Statement Better treatment tolerance today compared to previous session. Decreased irritability of symptoms observed. Decreased pain with decreasing upper trap muscle tension and improving lower trap muscle activation. Difficulty with proper scapular mechanics needing max cues to perform properly. Fair tolerance to today's session. Pt will benefit from continued skilled physical therapy services to decrease pain, improve scapular mechanics, strength, and function.    Personal Factors and Comorbidities Comorbidity 3+;Age;Fitness;Past/Current Experience;Time since onset of injury/illness/exacerbation    Comorbidities DM, GERD,  goiter    Examination-Activity Limitations Hygiene/Grooming;Lift;Caring for Others;Reach Overhead;Carry;Dressing    Stability/Clinical Decision Making Stable/Uncomplicated   Pain is overall worsening per  pt   Clinical Decision Making Low    Rehab Potential Fair    PT Frequency 2x / week    PT Duration 8 weeks    PT Treatment/Interventions Therapeutic activities;Therapeutic exercise;Neuromuscular re-education;Patient/family education;Manual techniques;Dry needling;Spinal Manipulations;Joint Manipulations;Electrical Stimulation;Iontophoresis 4mg /ml Dexamethasone;Traction    PT Next Visit Plan posture, scapular, anterior cervical strengthening, R shoulder ROM, manual techniques, modalities PRN    Consulted and Agree with Plan of Care Patient           Patient will benefit from skilled therapeutic intervention in order to improve the following deficits and impairments:  Pain,Postural dysfunction,Improper body mechanics,Decreased activity tolerance,Decreased range of motion,Decreased strength  Visit Diagnosis: Cervicalgia  Cervical radiculitis  Pain in right arm     Problem List There are no problems to display for this patient.   Joneen Boers PT, DPT   02/08/2021, 12:58 PM  [facility] [address], [place], [address] Phone: [phone]   Fax:  [phone]  Name: Kayla Love MRN: [id number] Date of Birth: [birth year]

## 2021-02-16 ENCOUNTER — Other Ambulatory Visit: Payer: Self-pay

## 2021-02-16 ENCOUNTER — Ambulatory Visit: Payer: Medicare HMO

## 2021-02-16 DIAGNOSIS — M5412 Radiculopathy, cervical region: Secondary | ICD-10-CM | POA: Diagnosis not present

## 2021-02-16 DIAGNOSIS — M542 Cervicalgia: Secondary | ICD-10-CM

## 2021-02-16 DIAGNOSIS — M79601 Pain in right arm: Secondary | ICD-10-CM

## 2021-02-16 NOTE — Therapy (Addendum)
[facility] [address], [place], [address] Phone: [phone]   Fax:  [phone]  Physical Therapy Treatment  Patient Details  Name: Kayla Love MRN: [id number] Date of Birth: [birth year] Referring Provider (PT): Hendricks Milo, FNP   Encounter Date: 02/16/2021   PT End of Session - 02/16/21 0934    Visit Number 3    Number of Visits 17    Date for PT Re-Evaluation 03/31/21    PT Start Time 0931    PT Stop Time 1011    PT Time Calculation (min) 40 min    Activity Tolerance Patient limited by pain;Patient tolerated treatment well    Behavior During Therapy [facility] for tasks assessed/performed           No past medical history on file.  Past Surgical History:  Procedure Laterality Date  . CHOLECYSTECTOMY      There were no vitals filed for this visit.   Subjective Assessment - 02/16/21 0932    Subjective Pt had a fairly episode of crmaping in right arm andn neck, insidious onset, today more sore as a result. Pt reports she was never given an HEP handout.    Pertinent History Pain in R arm. Pt has a lot cramps. She would have pain at R triceps, R forearm (C5/6 dermatome) as well as R upper trap area. Pt has has the cramping for about a year or 2. Had one of the worst cramps she has had about 2-3 weeks ago. Pt was just sitting on the couch, watching TV and reached over to the side to pick up her phone to pick it up and her R UE locked up. Has not had and imaging done for her shoulder or neck. Also has cramps B calves which ball up like knots.  L hand dominant but pt uses her R hand most. Pain has been getting worse. Today is the best day she has had so far. Pain gets worse as the day progresses.    Pain Score 6     Pain Location --   Righjt shoulder, neck         INTERVENTION THIS DATE:  -Left UT sustained release x8 minutes -Left UT ART paired with Rt chest press x60sec, then Rt shoulder flexion  [id number] -heat application to upper trap concurrent with session  -Right pec minor sustained release x 3 minutes, Rt scapular retraction P/ROM stretch 2x90sec   -Supine scapular retraction 2x15x3secH (hands clasped across ABD)  -Supine cervical retraction into 2 pillows (slight flexion to neutral) 2x15x3secH -Supine Shoulder Extension (elbows into mat) 2x15x3secH   -Bike ergometer 60sec FWD, 60sec retro, 60sec FWD, 60sec retro     PT Short Term Goals - 02/01/21 1213      PT SHORT TERM GOAL #1   Title Pt will be independent with her initial HEP to decrease pain, improve strength and ability to look around as well as reach with R UE with less pain.    Time 3    Period Weeks    Status New    Target Date 02/24/21             PT Long Term Goals - 02/01/21 1214      PT LONG TERM GOAL #1   Title Pt will have a decrease in R UE pain to 5/10 or less at worst to promote ability to reach with less pain.    Baseline 10/10 R UE pain at  most for the past 7 days (02/01/2021)    Time 8    Period Weeks    Status New    Target Date 03/31/21      PT LONG TERM GOAL #2   Title Pt will improve R and L cervical rotation AROM by at least 15 degrees to promote ability to look around as well as reach with R UE with less pain.    Baseline Cervical rotation AROM 22 degrees R, 30 degrees L (02/01/2021)    Time 8    Period Weeks    Status New    Target Date 03/31/21      PT LONG TERM GOAL #3   Title Pt will improve her cervical FOTO score by at least 10 points as a demonstration of improved function.    Baseline Neck FOTO 32 (02/01/2021)    Time 8    Period Weeks    Status New    Target Date 03/31/21      PT LONG TERM GOAL #4   Title Pt will improve R shoulder flexion AROM to 140 degrees or more to promote ability to reach with less difficulty.    Baseline R shoulder flexion AROM 101 degrees with pain (02/01/2021)    Time 8    Period Weeks    Status New    Target Date 03/31/21                  Plan - 02/16/21 0935    Clinical Impression Statement Continued with current plan of care as laid out in evaluation and recent prior sessions. Pt remains motivated to advance progress toward goals. Rest breaks provided as needed, pt quick to ask when needed. Author maintains all interventions within appropriate level of intensity as not to purposefully exacerbate pain. Heavy manual therapy emphasis due to substantial spasm of Rt UT. Noted pain relief response to Rt pec minor release. Heat used during much of session. Progress cervicoscapular stabilization isometrics with good tolerance. Pt does require varying levels of assistance and cuing for completion of exercises for correct form and sometimes due to pain/weakness. Pt continues to demonstrate progress toward goals AEB progression of some interventions this date either in volume or intensity. No updates to HEP this date.    Personal Factors and Comorbidities Comorbidity 3+;Age;Fitness;Past/Current Experience;Time since onset of injury/illness/exacerbation    Comorbidities DM, GERD, goiter    Examination-Activity Limitations Hygiene/Grooming;Lift;Caring for Others;Reach Overhead;Carry;Dressing    Stability/Clinical Decision Making Stable/Uncomplicated    Clinical Decision Making Low    Rehab Potential Fair    PT Frequency 2x / week    PT Duration 8 weeks    PT Treatment/Interventions Therapeutic activities;Therapeutic exercise;Neuromuscular re-education;Patient/family education;Manual techniques;Dry needling;Spinal Manipulations;Joint Manipulations;Electrical Stimulation;Iontophoresis 4mg /ml Dexamethasone;Traction    PT Next Visit Plan posture, scapular, anterior cervical strengthening, R shoulder ROM, manual techniques, modalities PRN    Consulted and Agree with Plan of Care Patient           Patient will benefit from skilled therapeutic intervention in order to improve the following deficits and impairments:  Pain,Postural  dysfunction,Improper body mechanics,Decreased activity tolerance,Decreased range of motion,Decreased strength  Visit Diagnosis: Cervicalgia  Cervical radiculitis  Pain in right arm     Problem List There are no problems to display for this patient.  9:59 AM, 02/16/21 Etta Grandchild, PT, DPT Physical Therapist - [place] [phone] (Office)    Laykin Rainone C 02/16/2021, 9:50 AM  [facility]  [address] [place], [address] Phone: [phone]   Fax:  [phone]  Name: EILIDH MARCANO MRN: [id number] Date of Birth: [birth year]

## 2021-02-21 ENCOUNTER — Ambulatory Visit: Payer: Medicare HMO | Attending: Family Medicine

## 2021-02-21 ENCOUNTER — Other Ambulatory Visit: Payer: Self-pay

## 2021-02-21 DIAGNOSIS — M542 Cervicalgia: Secondary | ICD-10-CM | POA: Diagnosis present

## 2021-02-21 DIAGNOSIS — M79601 Pain in right arm: Secondary | ICD-10-CM | POA: Insufficient documentation

## 2021-02-21 DIAGNOSIS — M5412 Radiculopathy, cervical region: Secondary | ICD-10-CM | POA: Insufficient documentation

## 2021-02-21 NOTE — Therapy (Signed)
[facility] [address] [place], [address] Phone: [phone]   Fax:  [phone]  Physical Therapy Treatment  Patient Details  Name: Kayla Love MRN: [id number] Date of Birth: 28-Jun-1951 Referring Provider (PT): Hendricks Milo, FNP   Encounter Date: 02/21/2021   PT End of Session - 02/21/21 0933    Visit Number 4    Number of Visits 17    Date for PT Re-Evaluation 03/31/21    PT Start Time 0933    PT Stop Time 1017    PT Time Calculation (min) 44 min    Activity Tolerance Patient limited by pain;Patient tolerated treatment well    Behavior During Therapy [facility] for tasks assessed/performed           No past medical history on file.  Past Surgical History:  Procedure Laterality Date  . CHOLECYSTECTOMY      There were no vitals filed for this visit.   Subjective Assessment - 02/21/21 0934    Subjective R UE is good. Not really any pain currently.    Pertinent History Pain in R arm. Pt has a lot cramps. She would have pain at R triceps, R forearm (C5/6 dermatome) as well as R upper trap area. Pt has has the cramping for about a year or 2. Had one of the worst cramps she has had about 2-3 weeks ago. Pt was just sitting on the couch, watching TV and reached over to the side to pick up her phone to pick it up and her R UE locked up. Has not had and imaging done for her shoulder or neck. Also has cramps B calves which ball up like knots.  L hand dominant but pt uses her R hand most. Pain has been getting worse. Today is the best day she has had so far. Pain gets worse as the day progresses.    Currently in Pain? No/denies                                     PT Education - 02/21/21 0958    Education Details ther-ex, HEP    Person(s) Educated Patient    Methods Explanation;Demonstration;Tactile cues;Verbal cues;Handout    Comprehension Returned demonstration;Verbalized  understanding          Objective  No latex allergies No blood pressure problems per pt.   Medbridge Access Code [email or web address]   Manual therapy Seated STM R distal pectoralis muscle Seated STM L upper trap and rhomboid muscles to decrease tension   Seated STM posterior lower cervical area to decrease fascial restrictions   Therapeutic exercise  Seated B scapular retraction with PT. Difficult  Supine scapular retraction 10x5 seconds for 3 sets  Supine cervical retraction into 2 pillows (slight flexion to neutral) 10x5 seconds for 3 sets   Supine Shoulder Extension (elbows into mat) 10x2 with 5 second holds  Seated thoracic extension 10x   Improved exercise technique, movement at target joints, use of target muscles after mod to max verbal, visual, tactile cues.   Response to treatment Pt tolerated session well without aggravation of symptoms.    Clinical impression Improving R UE pain level based on subjective reports. Continued working on decreasing muscle tension around neck as well as improving lower trap and anterior cervical strength to decrease stress to lower cervical spine and nerves going down R UE. Decreased irritability of  symptoms observed. Pt will benefit from continued skilled physical therapy services to decrease pain, improve strength and function.       PT Short Term Goals - 02/01/21 1213      PT SHORT TERM GOAL #1   Title Pt will be independent with her initial HEP to decrease pain, improve strength and ability to look around as well as reach with R UE with less pain.    Time 3    Period Weeks    Status New    Target Date 02/24/21             PT Long Term Goals - 02/01/21 1214      PT LONG TERM GOAL #1   Title Pt will have a decrease in R UE pain to 5/10 or less at worst to promote ability to reach with less pain.    Baseline 10/10 R UE pain at most for the past 7 days (02/01/2021)    Time 8    Period Weeks    Status New     Target Date 03/31/21      PT LONG TERM GOAL #2   Title Pt will improve R and L cervical rotation AROM by at least 15 degrees to promote ability to look around as well as reach with R UE with less pain.    Baseline Cervical rotation AROM 22 degrees R, 30 degrees L (02/01/2021)    Time 8    Period Weeks    Status New    Target Date 03/31/21      PT LONG TERM GOAL #3   Title Pt will improve her cervical FOTO score by at least 10 points as a demonstration of improved function.    Baseline Neck FOTO 32 (02/01/2021)    Time 8    Period Weeks    Status New    Target Date 03/31/21      PT LONG TERM GOAL #4   Title Pt will improve R shoulder flexion AROM to 140 degrees or more to promote ability to reach with less difficulty.    Baseline R shoulder flexion AROM 101 degrees with pain (02/01/2021)    Time 8    Period Weeks    Status New    Target Date 03/31/21                 Plan - 02/21/21 0933    Clinical Impression Statement Improving R UE pain level based on subjective reports. Continued working on decreasing muscle tension around neck as well as improving lower trap and anterior cervical strength to decrease stress to lower cervical spine and nerves going down R UE. Decreased irritability of symptoms observed. Pt will benefit from continued skilled physical therapy services to decrease pain, improve strength and function.    Personal Factors and Comorbidities Comorbidity 3+;Age;Fitness;Past/Current Experience;Time since onset of injury/illness/exacerbation    Comorbidities DM, GERD, goiter    Examination-Activity Limitations Hygiene/Grooming;Lift;Caring for Others;Reach Overhead;Carry;Dressing    Stability/Clinical Decision Making Stable/Uncomplicated    Clinical Decision Making Low    Rehab Potential Fair    PT Frequency 2x / week    PT Duration 8 weeks    PT Treatment/Interventions Therapeutic activities;Therapeutic exercise;Neuromuscular re-education;Patient/family  education;Manual techniques;Dry needling;Spinal Manipulations;Joint Manipulations;Electrical Stimulation;Iontophoresis 4mg /ml Dexamethasone;Traction    PT Next Visit Plan posture, scapular, anterior cervical strengthening, R shoulder ROM, manual techniques, modalities PRN    Consulted and Agree with Plan of Care Patient  Patient will benefit from skilled therapeutic intervention in order to improve the following deficits and impairments:  Pain,Postural dysfunction,Improper body mechanics,Decreased activity tolerance,Decreased range of motion,Decreased strength  Visit Diagnosis: Cervicalgia  Cervical radiculitis  Pain in right arm     Problem List There are no problems to display for this patient.     Joneen Boers PT, DPT   02/21/2021, 4:01 PM  [facility] [address], [place], [address] Phone: [phone]   Fax:  [phone]  Name: Kayla Love MRN: [id number] Date of Birth: 10-12-1951

## 2021-02-21 NOTE — Patient Instructions (Signed)
Access Code: [email or web address] URL: [email or web address] Date: 02/21/2021 Prepared by: Joneen Boers  Exercises Supine Scapular Retraction - 1 x daily - 7 x weekly - 3 sets - 10 reps - 5 seconds hold

## 2021-02-22 ENCOUNTER — Other Ambulatory Visit: Payer: Self-pay | Admitting: Otolaryngology

## 2021-02-22 DIAGNOSIS — R221 Localized swelling, mass and lump, neck: Secondary | ICD-10-CM

## 2021-02-24 ENCOUNTER — Ambulatory Visit: Payer: Medicare HMO

## 2021-02-24 ENCOUNTER — Telehealth: Payer: Self-pay

## 2021-02-24 NOTE — Telephone Encounter (Signed)
No show. Tried calling patient with the phone number provided but it was the wrong number

## 2021-03-01 ENCOUNTER — Other Ambulatory Visit: Payer: Self-pay

## 2021-03-01 ENCOUNTER — Ambulatory Visit: Payer: Medicare HMO

## 2021-03-01 DIAGNOSIS — M79601 Pain in right arm: Secondary | ICD-10-CM

## 2021-03-01 DIAGNOSIS — M542 Cervicalgia: Secondary | ICD-10-CM | POA: Diagnosis not present

## 2021-03-01 DIAGNOSIS — M5412 Radiculopathy, cervical region: Secondary | ICD-10-CM

## 2021-03-01 NOTE — Therapy (Signed)
[place] [facility] [address], [place], [address] Phone: [phone]   Fax:  [phone]  Physical Therapy Treatment  Patient Details  Name: Kayla Love MRN: [id number] Date of Birth: [birth year] Referring Provider (PT): Hendricks Milo, FNP   Encounter Date: 03/01/2021   PT End of Session - 03/01/21 1151    Visit Number 5    Number of Visits 17    Date for PT Re-Evaluation 03/31/21    PT Start Time 3557    PT Stop Time 1232    PT Time Calculation (min) 41 min    Activity Tolerance Patient limited by pain;Patient tolerated treatment well    Behavior During Therapy [facility] for tasks assessed/performed           No past medical history on file.  Past Surgical History:  Procedure Laterality Date  . CHOLECYSTECTOMY      There were no vitals filed for this visit.   Subjective Assessment - 03/01/21 1153    Subjective R arm is nagging right now. Felt pretty good after last session. Took a nap on her back on her bed as usual then woke on her back up with neck stiffness. 4-5/10 R arm nagging pain currently.    Pertinent History Pain in R arm. Pt has a lot cramps. She would have pain at R triceps, R forearm (C5/6 dermatome) as well as R upper trap area. Pt has has the cramping for about a year or 2. Had one of the worst cramps she has had about 2-3 weeks ago. Pt was just sitting on the couch, watching TV and reached over to the side to pick up her phone to pick it up and her R UE locked up. Has not had and imaging done for her shoulder or neck. Also has cramps B calves which ball up like knots.  L hand dominant but pt uses her R hand most. Pain has been getting worse. Today is the best day she has had so far. Pain gets worse as the day progresses.    Currently in Pain? Yes    Pain Score 5     Pain Descriptors / Indicators Nagging                                     PT Education - 03/01/21 1153     Education Details ther-ex    Person(s) Educated Patient    Methods Explanation;Demonstration;Tactile cues;Verbal cues    Comprehension Returned demonstration;Verbalized understanding          Objective  No latex allergies No blood pressure problems per pt.   Medbridge Access Code [email or web address]   Manual therapy Seated STM L and R upper trap and rhomboidmusclesto decrease tension   Seated STM posterior cervical area to decrease fascial restrictions  Decreased neck stiffness and decreased ache    Therapeutic exercise  Seated manually resisted scapular retraction isometrics PT manual resistance targeting lower trap muscle  R 10x5 seconds for 3 sets  L 10x5 seconds for 3 sets. Very very challenging with motor control for L scapular retraction and depression  Max verbal, tactile cues for proper movement.   Decreased nagging Seated manually resisted R scapular depression with PT manual resistance  R 10x5 seconds for 2 sets  Decreased R lateral neck discomfort.     Improved exercise technique, movement at target joints, use of target  muscles after modto maxverbal, visual, tactile cues.   Response to treatment Decreased nagging symptoms to 2/10 at end of session. Pt states if feels a whole lot better.    Clinical impression R upper trap and R lateral neck area symptoms seem to be muscle tension related. Treatment to decrease muscle tension to aforementioned areas helped decrease discomfort. Pt also demonstrates poor glenohumeral control with max cues to decrease upper trap and low back compensation with scapular retraction. Decreased pain/symptoms to 2/10 after session. Pt will benefit from continued skilled physical therapy services to decrease pain, improve strength and function.       PT Short Term Goals - 02/01/21 1213      PT SHORT TERM GOAL #1   Title Pt will be independent with her initial HEP to decrease pain, improve strength and ability to look  around as well as reach with R UE with less pain.    Time 3    Period Weeks    Status New    Target Date 02/24/21             PT Long Term Goals - 02/01/21 1214      PT LONG TERM GOAL #1   Title Pt will have a decrease in R UE pain to 5/10 or less at worst to promote ability to reach with less pain.    Baseline 10/10 R UE pain at most for the past 7 days (02/01/2021)    Time 8    Period Weeks    Status New    Target Date 03/31/21      PT LONG TERM GOAL #2   Title Pt will improve R and L cervical rotation AROM by at least 15 degrees to promote ability to look around as well as reach with R UE with less pain.    Baseline Cervical rotation AROM 22 degrees R, 30 degrees L (02/01/2021)    Time 8    Period Weeks    Status New    Target Date 03/31/21      PT LONG TERM GOAL #3   Title Pt will improve her cervical FOTO score by at least 10 points as a demonstration of improved function.    Baseline Neck FOTO 32 (02/01/2021)    Time 8    Period Weeks    Status New    Target Date 03/31/21      PT LONG TERM GOAL #4   Title Pt will improve R shoulder flexion AROM to 140 degrees or more to promote ability to reach with less difficulty.    Baseline R shoulder flexion AROM 101 degrees with pain (02/01/2021)    Time 8    Period Weeks    Status New    Target Date 03/31/21                 Plan - 03/01/21 1230    Clinical Impression Statement R upper trap and R lateral neck area symptoms seem to be muscle tension related. Treatment to decrease muscle tension to aforementioned areas helped decrease discomfort. Pt also demonstrates poor glenohumeral control with max cues to decrease upper trap and low back compensation with scapular retraction. Decreased pain/symptoms to 2/10 after session. Pt will benefit from continued skilled physical therapy services to decrease pain, improve strength and function.    Personal Factors and Comorbidities Comorbidity 3+;Age;Fitness;Past/Current  Experience;Time since onset of injury/illness/exacerbation    Comorbidities DM, GERD, goiter    Examination-Activity Limitations Hygiene/Grooming;Lift;Caring for Others;Reach Overhead;Carry;Dressing  Stability/Clinical Decision Making Stable/Uncomplicated    Rehab Potential Fair    PT Frequency 2x / week    PT Duration 8 weeks    PT Treatment/Interventions Therapeutic activities;Therapeutic exercise;Neuromuscular re-education;Patient/family education;Manual techniques;Dry needling;Spinal Manipulations;Joint Manipulations;Electrical Stimulation;Iontophoresis 4mg /ml Dexamethasone;Traction    PT Next Visit Plan posture, scapular, anterior cervical strengthening, R shoulder ROM, manual techniques, modalities PRN    Consulted and Agree with Plan of Care Patient           Patient will benefit from skilled therapeutic intervention in order to improve the following deficits and impairments:  Pain,Postural dysfunction,Improper body mechanics,Decreased activity tolerance,Decreased range of motion,Decreased strength  Visit Diagnosis: Cervicalgia  Cervical radiculitis  Pain in right arm     Problem List There are no problems to display for this patient.   Joneen Boers PT, DPT   03/01/2021, 12:56 PM  [place] [facility] [address], [place], [address] Phone: [phone]   Fax:  [phone]  Name: Kayla Love MRN: [id number] Date of Birth: 05-14-1951

## 2021-03-03 ENCOUNTER — Ambulatory Visit: Payer: Medicare HMO

## 2021-03-03 ENCOUNTER — Other Ambulatory Visit: Payer: Self-pay

## 2021-03-03 DIAGNOSIS — M5412 Radiculopathy, cervical region: Secondary | ICD-10-CM

## 2021-03-03 DIAGNOSIS — M542 Cervicalgia: Secondary | ICD-10-CM | POA: Diagnosis not present

## 2021-03-03 DIAGNOSIS — M79601 Pain in right arm: Secondary | ICD-10-CM

## 2021-03-03 NOTE — Therapy (Signed)
[place] [facility] [address] [place], [address] Phone: [phone]   Fax:  [phone]  Physical Therapy Treatment  Patient Details  Name: Kayla Love MRN: [id number] Date of Birth: [birth year] Referring Provider (PT): Hendricks Milo, FNP   Encounter Date: 03/03/2021   PT End of Session - 03/03/21 1019    Visit Number 6    Number of Visits 17    Date for PT Re-Evaluation 03/31/21    PT Start Time 1019    PT Stop Time 1100    PT Time Calculation (min) 41 min    Activity Tolerance Patient limited by pain;Patient tolerated treatment well    Behavior During Therapy [facility] for tasks assessed/performed           No past medical history on file.  Past Surgical History:  Procedure Laterality Date  . CHOLECYSTECTOMY      There were no vitals filed for this visit.   Subjective Assessment - 03/03/21 1020    Subjective R arm is better. Just a tiny bit of pain currently. Feels better than it did 3 weeks ago.    Pertinent History Pain in R arm. Pt has a lot cramps. She would have pain at R triceps, R forearm (C5/6 dermatome) as well as R upper trap area. Pt has has the cramping for about a year or 2. Had one of the worst cramps she has had about 2-3 weeks ago. Pt was just sitting on the couch, watching TV and reached over to the side to pick up her phone to pick it up and her R UE locked up. Has not had and imaging done for her shoulder or neck. Also has cramps B calves which ball up like knots.  L hand dominant but pt uses her R hand most. Pain has been getting worse. Today is the best day she has had so far. Pain gets worse as the day progresses.    Currently in Pain? Other (Comment)   No pain level provided.                                    PT Education - 03/03/21 1029    Education Details ther-ex    Person(s) Educated Patient    Methods Explanation;Demonstration;Tactile cues;Verbal cues     Comprehension Returned demonstration;Verbalized understanding             Objective  No latex allergies No blood pressure problems per pt.   MedbridgeAccess Code [email or web address]   Manual therapy Seated STM L and R upper trap and rhomboidmusclesto decrease muscle tension   Seated STM R upper trap area to decrease fascial restrictions   Seated STM posterior cervical area to decrease muscle tension and fascial restrictions    Therapeutic exercise  Seated manually resisted scapular retraction isometrics PT manual resistance targeting lower trap muscle             R 10x5 seconds for 3 sets             L 10x5 seconds for 3 sets. Very very challenging with motor control for L scapular retraction and depression             Max verbal, tactile cues for proper movement.              Seated manually resisted R scapular depression with PT manual resistance  R 10x5 seconds for 3 sets            Improved exercise technique, movement at target joints, use of target muscles after modto maxverbal, visual, tactile cues.   Response to treatment No symptoms after session.    Clinical impression Improving B scapular mechanics observed. Still has difficulty with mechanics L scapula. > R. No symptoms reported after session. Pt states she can mover her neck better after session. Continued working on improving lower trap strength as well as decreasing upper trap and cervical muscle tension and fascial restrictions. Pt will benefit from continued skilled physical therapy services to decrease pain, improve strength and function.            PT Short Term Goals - 02/01/21 1213      PT SHORT TERM GOAL #1   Title Pt will be independent with her initial HEP to decrease pain, improve strength and ability to look around as well as reach with R UE with less pain.    Time 3    Period Weeks    Status New    Target Date 02/24/21             PT Long Term Goals -  02/01/21 1214      PT LONG TERM GOAL #1   Title Pt will have a decrease in R UE pain to 5/10 or less at worst to promote ability to reach with less pain.    Baseline 10/10 R UE pain at most for the past 7 days (02/01/2021)    Time 8    Period Weeks    Status New    Target Date 03/31/21      PT LONG TERM GOAL #2   Title Pt will improve R and L cervical rotation AROM by at least 15 degrees to promote ability to look around as well as reach with R UE with less pain.    Baseline Cervical rotation AROM 22 degrees R, 30 degrees L (02/01/2021)    Time 8    Period Weeks    Status New    Target Date 03/31/21      PT LONG TERM GOAL #3   Title Pt will improve her cervical FOTO score by at least 10 points as a demonstration of improved function.    Baseline Neck FOTO 32 (02/01/2021)    Time 8    Period Weeks    Status New    Target Date 03/31/21      PT LONG TERM GOAL #4   Title Pt will improve R shoulder flexion AROM to 140 degrees or more to promote ability to reach with less difficulty.    Baseline R shoulder flexion AROM 101 degrees with pain (02/01/2021)    Time 8    Period Weeks    Status New    Target Date 03/31/21                 Plan - 03/03/21 1033    Clinical Impression Statement Improving B scapular mechanics observed. Still has difficulty with mechanics L scapula. > R. No symptoms reported after session. Pt states she can mover her neck better after session. Continued working on improving lower trap strength as well as decreasing upper trap and cervical muscle tension and fascial restrictions. Pt will benefit from continued skilled physical therapy services to decrease pain, improve strength and function.    Personal Factors and Comorbidities Comorbidity 3+;Age;Fitness;Past/Current Experience;Time since onset of injury/illness/exacerbation    Comorbidities  DM, GERD, goiter    Examination-Activity Limitations Hygiene/Grooming;Lift;Caring for Others;Reach  Overhead;Carry;Dressing    Stability/Clinical Decision Making Stable/Uncomplicated    Clinical Decision Making Low    Rehab Potential Fair    PT Frequency 2x / week    PT Duration 8 weeks    PT Treatment/Interventions Therapeutic activities;Therapeutic exercise;Neuromuscular re-education;Patient/family education;Manual techniques;Dry needling;Spinal Manipulations;Joint Manipulations;Electrical Stimulation;Iontophoresis 4mg /ml Dexamethasone;Traction    PT Next Visit Plan posture, scapular, anterior cervical strengthening, R shoulder ROM, manual techniques, modalities PRN    Consulted and Agree with Plan of Care Patient           Patient will benefit from skilled therapeutic intervention in order to improve the following deficits and impairments:  Pain,Postural dysfunction,Improper body mechanics,Decreased activity tolerance,Decreased range of motion,Decreased strength  Visit Diagnosis: Cervicalgia  Cervical radiculitis  Pain in right arm     Problem List There are no problems to display for this patient.   Joneen Boers PT, DPT  03/03/2021, 1:33 PM  [place] [facility] [address] [place], [address] Phone: [phone]   Fax:  [phone]  Name: Kayla Love MRN: [id number] Date of Birth: 08-26-1951

## 2021-03-08 ENCOUNTER — Ambulatory Visit: Payer: Medicare HMO

## 2021-03-09 ENCOUNTER — Ambulatory Visit
Admission: RE | Admit: 2021-03-09 | Discharge: 2021-03-09 | Disposition: A | Discharge status: Home / Self Care | Payer: Medicare HMO | Source: Ambulatory Visit | Attending: Otolaryngology | Admitting: Otolaryngology

## 2021-03-09 ENCOUNTER — Other Ambulatory Visit: Payer: Self-pay

## 2021-03-09 DIAGNOSIS — R221 Localized swelling, mass and lump, neck: Secondary | ICD-10-CM | POA: Diagnosis present

## 2021-03-09 HISTORY — DX: Type 2 diabetes mellitus without complications: E11.9

## 2021-03-09 MED ORDER — IOHEXOL 300 MG/ML  SOLN
75.0000 mL | Freq: Once | INTRAMUSCULAR | Status: AC | PRN
  Administered 2021-03-09: 75 mL via INTRAVENOUS

## 2021-03-10 ENCOUNTER — Ambulatory Visit: Payer: Medicare HMO

## 2021-03-10 ENCOUNTER — Telehealth: Payer: Self-pay

## 2021-03-10 LAB — POCT I-STAT CREATININE: Creatinine, Ser: 1.1 mg/dL — ABNORMAL HIGH (ref 0.44–1.00)

## 2021-03-10 NOTE — Telephone Encounter (Signed)
No show. Called patient phone number and it was the wrong number in the system.

## 2021-03-15 ENCOUNTER — Ambulatory Visit: Payer: Medicare HMO

## 2021-03-17 ENCOUNTER — Ambulatory Visit: Payer: Medicare HMO

## 2021-03-23 ENCOUNTER — Ambulatory Visit: Payer: Medicare HMO | Attending: Family Medicine

## 2021-05-12 ENCOUNTER — Other Ambulatory Visit: Payer: Self-pay

## 2021-05-12 ENCOUNTER — Ambulatory Visit
Admission: EM | Admit: 2021-05-12 | Discharge: 2021-05-12 | Disposition: A | Discharge status: Home / Self Care | Payer: Medicare HMO | Attending: Emergency Medicine | Admitting: Emergency Medicine

## 2021-05-12 ENCOUNTER — Encounter: Payer: Self-pay | Admitting: Emergency Medicine

## 2021-05-12 DIAGNOSIS — T148XXA Other injury of unspecified body region, initial encounter: Secondary | ICD-10-CM

## 2021-05-12 DIAGNOSIS — S90851A Superficial foreign body, right foot, initial encounter: Secondary | ICD-10-CM | POA: Diagnosis not present

## 2021-05-12 MED ORDER — TETANUS-DIPHTH-ACELL PERTUSSIS 5-2.5-18.5 LF-MCG/0.5 IM SUSY
0.5000 mL | PREFILLED_SYRINGE | Freq: Once | INTRAMUSCULAR | Status: AC
  Administered 2021-05-12: 0.5 mL via INTRAMUSCULAR

## 2021-05-12 MED ORDER — CEPHALEXIN 500 MG PO CAPS: 500.0000 mg | ORAL_CAPSULE | Freq: Three times a day (TID) | ORAL | 0 refills | Status: AC

## 2021-05-12 NOTE — Discharge Instructions (Addendum)
Wound on your foot clean and dry.  Keep your feet out of shoes and keep your right foot elevated is much as possible to prevent infection from developing.  Take the Keflex 3 times a day with food for 7 days for prevention of infection.  If you develop any swelling at the site, redness at the site, drainage from the site, red streaks going up your leg, or you develop a fever you need to return for reevaluation or go to the ER.

## 2021-05-12 NOTE — ED Provider Notes (Addendum)
[facility]    CSN: [id number] Arrival date & time: 05/12/21  1407      History   Chief Complaint Chief Complaint  Patient presents with   Foot Injury    HPI Kayla Love is a 70 y.o. female.   HPI  71 year old female here for evaluation of foreign body in the right foot.  Patient ports that she was walking around in her yard without shoes on and she stepped on a fishing hook.  This happened little more than an hour ago.  She states that its not very painful unless its being moved around.  She denies any bleeding or drainage from the wound.  She does have a history of diabetes.  She is unsure when her last tetanus shot was but states that it has been "a while".  Past Medical History:  Diagnosis Date   Diabetes mellitus without complication ([place])     There are no problems to display for this patient.   Past Surgical History:  Procedure Laterality Date   CHOLECYSTECTOMY      OB History   No obstetric history on file.      Home Medications    Prior to Admission medications   Medication Sig Start Date End Date Taking? Authorizing Provider  cephALEXin (KEFLEX) 500 MG capsule Take 1 capsule (500 mg total) by mouth 3 (three) times daily for 7 days. 05/12/21 05/19/21 Yes Margarette Canada, NP  cetirizine (ZYRTEC) 10 MG tablet Take 10 mg by mouth daily.    [provider]  metaxalone (SKELAXIN) 800 MG tablet Take 1 tablet (800 mg total) by mouth 3 (three) times daily. 12/24/17   Lorin Picket, PA-C  metFORMIN (GLUCOPHAGE) 500 MG tablet Take 500 mg by mouth 2 (two) times daily with a meal.    [provider]  naproxen (NAPROSYN) 500 MG tablet Take 1 tablet (500 mg total) by mouth 2 (two) times daily with a meal. 12/24/17   Lorin Picket, PA-C    Family History History reviewed. No pertinent family history.  Social History Social History   Tobacco Use   Smoking status: Never   Smokeless tobacco: Never  Vaping Use   Vaping Use: Never  used  Substance Use Topics   Alcohol use: No     Allergies   Codeine and Shellfish allergy   Review of Systems Review of Systems  Constitutional:  Negative for fever.  Skin:  Positive for wound. Negative for color change.  Hematological: Negative.   Psychiatric/Behavioral: Negative.      Physical Exam Triage Vital Signs ED Triage Vitals  Enc Vitals Group     BP 05/12/21 1420 (!) 163/95     Pulse Rate 05/12/21 1420 97     Resp 05/12/21 1420 18     Temp 05/12/21 1420 (!) 97 F (36.1 C)     Temp src --      SpO2 05/12/21 1420 100 %     Weight --      Height --      Head Circumference --      Peak Flow --      Pain Score 05/12/21 1418 5     Pain Loc --      Pain Edu? --      Excl. in [place]? --    No data found.  Updated Vital Signs BP (!) 163/95   Pulse 97   Temp (!) 97 F (36.1 C)   Resp 18   SpO2  100%   Visual Acuity Right Eye Distance:   Left Eye Distance:   Bilateral Distance:    Right Eye Near:   Left Eye Near:    Bilateral Near:     Physical Exam Vitals and nursing note reviewed.  Constitutional:      Appearance: Normal appearance. She is obese. She is not ill-appearing.  HENT:     Head: Normocephalic and atraumatic.  Skin:    General: Skin is warm and dry.     Capillary Refill: Capillary refill takes less than 2 seconds.     Findings: No bruising or erythema.  Neurological:     General: No focal deficit present.     Mental Status: She is alert and oriented to person, place, and time.  Psychiatric:        Mood and Affect: Mood normal.        Behavior: Behavior normal.        Thought Content: Thought content normal.        Judgment: Judgment normal.     UC Treatments / Results  Labs (all labs ordered are listed, but only abnormal results are displayed) Labs Reviewed - No data to display  EKG   Radiology No results found.  Procedures Foreign Body Removal  Date/Time: 05/12/2021 2:55 PM Performed by: Margarette Canada, NP Authorized  by: Margarette Canada, NP   Consent:    Consent obtained:  Verbal   Consent given by:  Patient   Risks discussed:  Bleeding, infection, pain, worsening of condition and incomplete removal   Alternatives discussed:  Referral Universal protocol:    Patient identity confirmed:  Verbally with patient Location:    Location:  Foot   Foot location:  R sole   Depth:  Intradermal   Tendon involvement:  None Anesthesia:    Anesthesia method:  Local infiltration   Local anesthetic:  Lidocaine 1% w/o epi (3 mL) Procedure details:    Localization method:  Visualized   Removal mechanism:  Forceps   Foreign bodies recovered:  1   Intact foreign body removal: yes   Post-procedure details:    Neurovascular status: intact     Confirmation:  No additional foreign bodies on visualization   Skin closure:  None   Dressing:  Open (no dressing)   Procedure completion:  Tolerated (including critical care time)  Medications Ordered in UC Medications  Tdap (BOOSTRIX) injection 0.5 mL (0.5 mLs Intramuscular Given 05/12/21 1501)    Initial Impression / Assessment and Plan / UC Course  I have reviewed the triage vital signs and the nursing notes.  Pertinent labs & imaging results that were available during my care of the patient were reviewed by me and considered in my medical decision making (see chart for details).  Patient is a very pleasant 70 year old female here for evaluation of a fishing hook in her right foot that she sustained while walking in the yard approximately an hour ago.  She states that it hurts when he moves around but not otherwise when is left alone.  Physical exam reveals a misshapened fishhook in the posterior lateral aspect of her right foot.  There is no ecchymosis or erythema at the site and there is no bleeding.  It appears that the majority of the hook is outside of the patient's foot and the hook itself is just stuck in the dermis.  Will anesthetize the patient's foot with local  lidocaine injection and attempt remove the fishhook.  We will also update patient's  tetanus shot.  Patient is diabetic and will discharge home on Keflex 3 times daily x7 days as prophylaxis to prevent infection.  Fishhook removed intact using a pair of needle-nose pliers.  The wound was cleansed with alcohol.  There is no bleeding present from the wound.  We will discharge patient home on Keflex 500 mg 3 times daily to prevent infection.  Return precautions reviewed with patient.   Final Clinical Impressions(s) / UC Diagnoses   Final diagnoses:  Puncture wound  Foreign body in foot, right, initial encounter     Discharge Instructions      Wound on your foot clean and dry.  Keep your feet out of shoes and keep your right foot elevated is much as possible to prevent infection from developing.  Take the Keflex 3 times a day with food for 7 days for prevention of infection.  If you develop any swelling at the site, redness at the site, drainage from the site, red streaks going up your leg, or you develop a fever you need to return for reevaluation or go to the ER.     ED Prescriptions     Medication Sig Dispense Auth. Provider   cephALEXin (KEFLEX) 500 MG capsule Take 1 capsule (500 mg total) by mouth 3 (three) times daily for 7 days. 21 capsule Margarette Canada, NP      PDMP not reviewed this encounter.   Margarette Canada, NP 05/12/21 1500    Margarette Canada, NP 05/12/21 1505

## 2021-05-12 NOTE — ED Triage Notes (Signed)
Pt is present today with a hook stuck in her right foot. PT states that she was walking outside and she noticed that she stepped on a fishing hook one hour ago

## 2021-06-24 ENCOUNTER — Other Ambulatory Visit: Payer: Self-pay | Admitting: Family Medicine

## 2021-06-24 DIAGNOSIS — Z1231 Encounter for screening mammogram for malignant neoplasm of breast: Secondary | ICD-10-CM

## 2021-12-08 ENCOUNTER — Encounter: Payer: Self-pay | Admitting: Family Medicine

## 2021-12-16 ENCOUNTER — Other Ambulatory Visit: Payer: Self-pay | Admitting: *Deleted

## 2021-12-16 ENCOUNTER — Encounter: Payer: Self-pay | Admitting: *Deleted

## 2021-12-21 ENCOUNTER — Telehealth: Payer: Self-pay

## 2021-12-21 NOTE — Telephone Encounter (Signed)
Spoke with patient and confirmed her NP appt scheduled for tomorrow afternoon at 3 pm. Patient verbalized understanding and will bring a list of medication with her to appt.  ?

## 2021-12-22 ENCOUNTER — Encounter: Payer: Self-pay | Admitting: Oncology

## 2021-12-22 ENCOUNTER — Inpatient Hospital Stay: Payer: Medicare HMO | Attending: Oncology | Admitting: Oncology

## 2021-12-22 ENCOUNTER — Other Ambulatory Visit: Payer: Self-pay

## 2021-12-22 ENCOUNTER — Inpatient Hospital Stay: Payer: Medicare HMO

## 2021-12-22 VITALS — BP 137/91 | HR 76 | Temp 97.0°F | Ht 65.0 in | Wt 216.0 lb

## 2021-12-22 DIAGNOSIS — R5383 Other fatigue: Secondary | ICD-10-CM | POA: Insufficient documentation

## 2021-12-22 DIAGNOSIS — Z800 Family history of malignant neoplasm of digestive organs: Secondary | ICD-10-CM | POA: Diagnosis not present

## 2021-12-22 DIAGNOSIS — Z79899 Other long term (current) drug therapy: Secondary | ICD-10-CM | POA: Diagnosis not present

## 2021-12-22 DIAGNOSIS — Z806 Family history of leukemia: Secondary | ICD-10-CM | POA: Insufficient documentation

## 2021-12-22 DIAGNOSIS — R7989 Other specified abnormal findings of blood chemistry: Secondary | ICD-10-CM | POA: Diagnosis present

## 2021-12-22 DIAGNOSIS — Z809 Family history of malignant neoplasm, unspecified: Secondary | ICD-10-CM | POA: Diagnosis not present

## 2021-12-22 DIAGNOSIS — Z8371 Family history of colonic polyps: Secondary | ICD-10-CM | POA: Insufficient documentation

## 2021-12-22 DIAGNOSIS — M2550 Pain in unspecified joint: Secondary | ICD-10-CM | POA: Insufficient documentation

## 2021-12-22 DIAGNOSIS — Z801 Family history of malignant neoplasm of trachea, bronchus and lung: Secondary | ICD-10-CM | POA: Insufficient documentation

## 2021-12-22 DIAGNOSIS — Z8041 Family history of malignant neoplasm of ovary: Secondary | ICD-10-CM | POA: Diagnosis not present

## 2021-12-22 DIAGNOSIS — Z8042 Family history of malignant neoplasm of prostate: Secondary | ICD-10-CM | POA: Insufficient documentation

## 2021-12-22 LAB — COMPREHENSIVE METABOLIC PANEL
ALT: 29 U/L (ref 0–44)
AST: 22 U/L (ref 15–41)
Albumin: 4.4 g/dL (ref 3.5–5.0)
Alkaline Phosphatase: 30 U/L — ABNORMAL LOW (ref 38–126)
Anion gap: 9 (ref 5–15)
BUN: 14 mg/dL (ref 8–23)
CO2: 26 mmol/L (ref 22–32)
Calcium: 9.4 mg/dL (ref 8.9–10.3)
Chloride: 100 mmol/L (ref 98–111)
Creatinine, Ser: 0.91 mg/dL (ref 0.44–1.00)
GFR, Estimated: >60 mL/min (ref >60)
Glucose, Bld: 97 mg/dL (ref 70–99)
Potassium: 3.9 mmol/L (ref 3.5–5.1)
Sodium: 135 mmol/L (ref 135–145)
Total Bilirubin: 0.1 mg/dL — ABNORMAL LOW (ref 0.3–1.2)
Total Protein: 7.6 g/dL (ref 6.5–8.1)

## 2021-12-22 LAB — SEDIMENTATION RATE: Sed Rate: 18 mm/hr (ref 0–30)

## 2021-12-22 LAB — HEPATITIS PANEL, ACUTE
HCV Ab: NONREACTIVE
Hep A IgM: NONREACTIVE
Hep B C IgM: NONREACTIVE
Hepatitis B Surface Ag: NONREACTIVE

## 2021-12-22 LAB — CBC WITH DIFFERENTIAL/PLATELET
Abs Immature Granulocytes: 0.02 10*3/uL (ref 0.00–0.07)
Basophils Absolute: 0 10*3/uL (ref 0.0–0.1)
Basophils Relative: 1 %
Eosinophils Absolute: 0.2 10*3/uL (ref 0.0–0.5)
Eosinophils Relative: 3 %
HCT: 36.9 % (ref 36.0–46.0)
Hemoglobin: 12 g/dL (ref 12.0–15.0)
Immature Granulocytes: 0 %
Lymphocytes Relative: 50 %
Lymphs Abs: 3.6 10*3/uL (ref 0.7–4.0)
MCH: 30.6 pg (ref 26.0–34.0)
MCHC: 32.5 g/dL (ref 30.0–36.0)
MCV: 94.1 fL (ref 80.0–100.0)
Monocytes Absolute: 0.5 10*3/uL (ref 0.1–1.0)
Monocytes Relative: 7 %
Neutro Abs: 2.8 10*3/uL (ref 1.7–7.7)
Neutrophils Relative %: 39 %
Platelets: 299 10*3/uL (ref 150–400)
RBC: 3.92 MIL/uL (ref 3.87–5.11)
RDW: 12.8 % (ref 11.5–15.5)
WBC: 7.2 10*3/uL (ref 4.0–10.5)
nRBC: 0 % (ref 0.0–0.2)

## 2021-12-22 LAB — IRON AND TIBC
Iron: 69 ug/dL (ref 28–170)
Saturation Ratios: 21 % (ref 10.4–31.8)
TIBC: 322 ug/dL (ref 250–450)
UIBC: 253 ug/dL

## 2021-12-22 LAB — C-REACTIVE PROTEIN: CRP: 0.9 mg/dL (ref <1.0)

## 2021-12-22 LAB — FERRITIN: Ferritin: 154 ng/mL (ref 11–307)

## 2021-12-22 NOTE — Progress Notes (Signed)
Hematology/Oncology Consult note [phone] [phone]         Patient Care Team: Bunnie Pion, FNP as PCP - General (Family Medicine) Earlie Server, MD as Consulting Physician (Hematology and Oncology) Ok Edwards, NP as Nurse Practitioner (Gastroenterology)  REFERRING PROVIDER: Bunnie Pion, FNP  CHIEF COMPLAINTS/REASON FOR VISIT:  Evaluation of elevated ferritin  HISTORY OF PRESENTING ILLNESS:   Kayla Love is a  71 y.o.  female with PMH listed below was seen in consultation at the request of  Bunnie Pion, FNP  for evaluation of elevated ferritin  Reviewed her blood work obtained by pcp.  11/17/21  iron 284, sat 29, ferritin 279  Patient denies alcohol use, family history of iron overload. + chronic joint pain, neck pain, right arm pain, some morning stiffness.  Denies weight loss, fever, chills, fatigue, night sweats.    Significant family history of cancer.    Review of Systems  Constitutional:  Positive for fatigue. Negative for appetite change, chills and fever.  HENT:   Negative for hearing loss and voice change.   Eyes:  Negative for eye problems.  Respiratory:  Negative for chest tightness and cough.   Cardiovascular:  Negative for chest pain.  Gastrointestinal:  Negative for abdominal distention, abdominal pain and blood in stool.  Endocrine: Negative for hot flashes.  Genitourinary:  Negative for difficulty urinating and frequency.   Musculoskeletal:  Positive for arthralgias and neck pain.  Skin:  Negative for itching and rash.  Neurological:  Negative for extremity weakness.  Hematological:  Negative for adenopathy.  Psychiatric/Behavioral:  Negative for confusion.    MEDICAL HISTORY:  Past Medical History:  Diagnosis Date   Allergic rhinitis    Atherosclerosis of abdominal aorta (HCC)    Atypical nevus    Bursitis of right shoulder    Cervical radiculitis    Cervicalgia    Degenerative joint disease     Diabetes mellitus without complication (HCC)    Diverticulosis    Dysphagia, oropharyngeal    Elevated ferritin    Family history of colon cancer    Family history of colonic polyps    Family history of gastric cancer    Family history of lung cancer    Family history of ovarian cancer    Frequent falls    Generalized anxiety disorder    GERD (gastroesophageal reflux disease)    Goiter    History of angina    History of esophageal stricture    had esophageal stricture dilated 2002   Hyperlipidemia    Hypomagnesemia    Internal hemorrhoid    Lymphadenopathy    Muscle cramp    Obesity    Peripheral neuropathy    Stress incontinence    Tinnitus, bilateral    Vertigo     SURGICAL HISTORY: Past Surgical History:  Procedure Laterality Date   CHOLECYSTECTOMY     COLONOSCOPY  2000   COLONOSCOPY  2005   COLONOSCOPY  07/2011   COLONOSCOPY  11/04/2019   FHCC/FHCP/Diverticulosis/Internal hemorrhoids/Otherwise normal - 5 year repeat per [facility].   ESOPHAGEAL DILATION  2002   ESOPHAGOGASTRODUODENOSCOPY  12/1998   ESOPHAGOGASTRODUODENOSCOPY  08/2004   ESOPHAGOGASTRODUODENOSCOPY  07/27/2011   LAPAROSCOPIC TUBAL LIGATION  1994   TONSILLECTOMY AND ADENOIDECTOMY      SOCIAL HISTORY: Social History   Socioeconomic History   Marital status: Single    Spouse name: Not on file   Number of children: Not on file   Years of education:  Not on file   Highest education level: Not on file  Occupational History   Not on file  Tobacco Use   Smoking status: Never    Passive exposure: Never   Smokeless tobacco: Never  Vaping Use   Vaping Use: Never used  Substance and Sexual Activity   Alcohol use: No   Drug use: Never   Sexual activity: Not on file  Other Topics Concern   Not on file  Social History Narrative   Not on file   Social Determinants of Health   Financial Resource Strain: Not on file  Food Insecurity: Not on file  Transportation Needs: Not on file  Physical  Activity: Not on file  Stress: Not on file  Social Connections: Not on file  Intimate Partner Violence: Not on file    FAMILY HISTORY: Family History  Problem Relation Age of Onset   Lung cancer Mother    Hypertension Father    Colon cancer Sister    Hypertension Sister    Diabetes Sister    Leukemia Sister    Sickle cell anemia Sister    Lung cancer Sister    Ovarian cancer Sister    Colon polyps Brother    Gastric cancer Brother    Hypertension Brother    Lung cancer Brother        "smoker"   Colon polyps Brother    Prostate cancer Brother    Colon polyps Brother    Splenomegaly Brother        mets to spleen   Dementia Brother    Colon polyps Brother    Ulcers Brother    Cancer Cousin        unknown type of cancer    ALLERGIES:  is allergic to other, codeine, and shellfish allergy.  MEDICATIONS:  Current Outpatient Medications  Medication Sig Dispense Refill   aspirin 81 MG chewable tablet Chew 1 tablet by mouth daily.     baclofen (LIORESAL) 10 MG tablet Take 10 mg by mouth 2 (two) times daily.     cetirizine (ZYRTEC) 10 MG tablet Take 10 mg by mouth daily.     ezetimibe (ZETIA) 10 MG tablet Take 1 tablet by mouth daily.     famotidine (PEPCID) 20 MG tablet Take 20 mg by mouth daily.     gabapentin (NEURONTIN) 300 MG capsule Take 1 capsule by mouth 2 (two) times daily.     JARDIANCE 10 MG TABS tablet Take 10 mg by mouth daily.     lisinopril (ZESTRIL) 2.5 MG tablet Take 1 tablet by mouth daily.     magnesium oxide (MAG-OX) 400 MG tablet Take 1 tablet by mouth daily.     meclizine (ANTIVERT) 25 MG tablet Take 1 tablet by mouth every 6 (six) hours as needed for dizziness.     meloxicam (MOBIC) 15 MG tablet Take 1 tablet by mouth daily.     metFORMIN (GLUCOPHAGE) 1000 MG tablet Take 1,000 mg by mouth 2 (two) times daily.     omeprazole (PRILOSEC) 40 MG capsule Take 1 capsule by mouth daily.     polyethylene glycol powder (GLYCOLAX/MIRALAX) 17 GM/SCOOP powder Take  as directed for colonic prep.     traZODone (DESYREL) 50 MG tablet Take 1 tablet by mouth at bedtime as needed for sleep.     Magnesium Oxide 250 MG TABS Take 1 tablet by mouth daily. (Patient not taking: Reported on 12/22/2021)     metFORMIN (GLUCOPHAGE) 500 MG tablet Take 500 mg  by mouth 2 (two) times daily with a meal. (Patient not taking: Reported on 12/22/2021)     No current facility-administered medications for this visit.     PHYSICAL EXAMINATION: ECOG PERFORMANCE STATUS: 0 - Asymptomatic Vitals:   12/22/21 1514  BP: (!) 137/91  Pulse: 76  Temp: (!) 97 F (36.1 C)   Filed Weights   12/22/21 1514  Weight: 216 lb (98 kg)    Physical Exam Constitutional:      General: She is not in acute distress.    Appearance: She is obese.  HENT:     Head: Normocephalic and atraumatic.  Eyes:     General: No scleral icterus. Cardiovascular:     Rate and Rhythm: Normal rate and regular rhythm.     Heart sounds: Normal heart sounds.  Pulmonary:     Effort: Pulmonary effort is normal. No respiratory distress.     Breath sounds: No wheezing.  Abdominal:     General: Bowel sounds are normal. There is no distension.     Palpations: Abdomen is soft.  Musculoskeletal:        General: No deformity. Normal range of motion.     Cervical back: Normal range of motion and neck supple.  Skin:    General: Skin is warm and dry.     Findings: No erythema or rash.  Neurological:     Mental Status: She is alert and oriented to person, place, and time. Mental status is at baseline.     Cranial Nerves: No cranial nerve deficit.     Coordination: Coordination normal.  Psychiatric:        Mood and Affect: Mood normal.    LABORATORY DATA:  I have reviewed the data as listed Lab Results  Component Value Date   WBC 7.2 12/22/2021   HGB 12.0 12/22/2021   HCT 36.9 12/22/2021   MCV 94.1 12/22/2021   PLT 299 12/22/2021   Recent Labs    03/09/21 0824 12/22/21 1540  NA  --  135  K  --  3.9   CL  --  100  CO2  --  26  GLUCOSE  --  97  BUN  --  14  CREATININE 1.10* 0.91  CALCIUM  --  9.4  GFRNONAA  --  >60  PROT  --  7.6  ALBUMIN  --  4.4  AST  --  22  ALT  --  29  ALKPHOS  --  30*  BILITOT  --  0.1*   Iron/TIBC/Ferritin/ %Sat    Component Value Date/Time   IRON 69 12/22/2021 1540   TIBC 322 12/22/2021 1540   FERRITIN 154 12/22/2021 1540   IRONPCTSAT 21 12/22/2021 1540      RADIOGRAPHIC STUDIES: I have personally reviewed the radiological images as listed and agreed with the findings in the report. No results found.    ASSESSMENT & PLAN:  1. Elevated ferritin   2. Arthralgia, unspecified joint   3. Family history of cancer    # Elevated ferrin, she has normal iron saturation.  Discussed with patient that this could be due to increased iron store, or due to acute or chronic inflammation. Check cbc, iron tibc ferritin, hepatitis panel, HIV.    # Arthralgia, check ANA, ESR, CRP.  # family history of cancer, recommend genetic testing. Refer to Dietitian.   Orders Placed This Encounter  Procedures   CBC with Differential/Platelet    Standing Status:   Future    Number  of Occurrences:   1    Standing Expiration Date:   12/23/2022   Comprehensive metabolic panel    Standing Status:   Future    Number of Occurrences:   1    Standing Expiration Date:   12/23/2022   Ferritin    Standing Status:   Future    Number of Occurrences:   1    Standing Expiration Date:   06/24/2022   Iron and TIBC    Standing Status:   Future    Number of Occurrences:   1    Standing Expiration Date:   12/23/2022   Hepatitis panel, acute    Standing Status:   Future    Number of Occurrences:   1    Standing Expiration Date:   12/23/2022   HIV Antibody (routine testing w rflx)    Standing Status:   Future    Number of Occurrences:   1    Standing Expiration Date:   12/23/2022   ANA, IFA (with reflex)    Standing Status:   Future    Number of Occurrences:   1    Standing  Expiration Date:   12/23/2022   Sedimentation rate    Standing Status:   Future    Number of Occurrences:   1    Standing Expiration Date:   12/22/2022   C-reactive protein    Standing Status:   Future    Number of Occurrences:   1    Standing Expiration Date:   12/23/2022    All questions were answered. The patient knows to call the clinic with any problems questions or concerns.  cc Bunnie Pion, FNP    Return of visit: 3-4 weeks.  Thank you for this kind referral and the opportunity to participate in the care of this patient. A copy of today's note is routed to referring provider   Earlie Server, MD, PhD [facility] Hematology Oncology 12/22/2021

## 2021-12-23 ENCOUNTER — Telehealth: Payer: Self-pay

## 2021-12-23 ENCOUNTER — Other Ambulatory Visit: Payer: Self-pay

## 2021-12-23 DIAGNOSIS — Z809 Family history of malignant neoplasm, unspecified: Secondary | ICD-10-CM

## 2021-12-23 NOTE — Telephone Encounter (Signed)
Referral to genetic counseling ordered.  ?

## 2021-12-23 NOTE — Telephone Encounter (Signed)
-----   Message from Earlie Server, MD sent at 12/22/2021 10:53 PM EST ----- ?Please refer her to genetic counselor. Reason is family history of cancer ?She is aware ? ?

## 2021-12-24 LAB — HIV ANTIBODY (ROUTINE TESTING W REFLEX): HIV Screen 4th Generation wRfx: NONREACTIVE

## 2021-12-28 LAB — FANA STAINING PATTERNS: Nuclear Dot Pattern: 67 — ABNORMAL HIGH

## 2021-12-28 LAB — ANTINUCLEAR ANTIBODIES, IFA: ANA Ab, IFA: POSITIVE — AB

## 2022-01-05 ENCOUNTER — Other Ambulatory Visit: Payer: Self-pay

## 2022-01-05 ENCOUNTER — Encounter: Payer: Self-pay | Admitting: Licensed Clinical Social Worker

## 2022-01-05 ENCOUNTER — Inpatient Hospital Stay (HOSPITAL_BASED_OUTPATIENT_CLINIC_OR_DEPARTMENT_OTHER): Payer: Medicare HMO | Admitting: Licensed Clinical Social Worker

## 2022-01-05 ENCOUNTER — Inpatient Hospital Stay: Payer: Medicare HMO

## 2022-01-05 DIAGNOSIS — Z7183 Encounter for nonprocreative genetic counseling: Secondary | ICD-10-CM

## 2022-01-05 DIAGNOSIS — Z801 Family history of malignant neoplasm of trachea, bronchus and lung: Secondary | ICD-10-CM

## 2022-01-05 DIAGNOSIS — Z8041 Family history of malignant neoplasm of ovary: Secondary | ICD-10-CM

## 2022-01-05 DIAGNOSIS — Z806 Family history of leukemia: Secondary | ICD-10-CM | POA: Diagnosis not present

## 2022-01-05 DIAGNOSIS — Z8042 Family history of malignant neoplasm of prostate: Secondary | ICD-10-CM

## 2022-01-05 NOTE — Progress Notes (Signed)
REFERRING PROVIDER: ?Earlie Server, MD ?[place],  [place] [address] ? ?PRIMARY PROVIDER:  ?Bunnie Pion, FNP ? ?PRIMARY REASON FOR VISIT:  ?1. Family history of prostate cancer   ?2. Family history of ovarian cancer   ?3. Family history of lung cancer   ?4. Family history of leukemia   ? ? ? ?HISTORY OF PRESENT ILLNESS:   ?Kayla Love, a 71 y.o. female, was seen for a [place] cancer genetics consultation at the request of Dr. Tasia Catchings due to a family history of cancer.  Kayla Love presents to clinic today to discuss the possibility of a hereditary predisposition to cancer, genetic testing, and to further clarify her future cancer risks, as well as potential cancer risks for family members.  ? ?Kayla Love is a 71 y.o. female with no personal history of cancer.   ? ?CANCER HISTORY:  ?Oncology History  ? No history exists.  ? ? ? ?RISK FACTORS:  ?Menarche was at age 81.  ?First live birth at age 33.  ?OCP use for approximately  several  years.  ?Ovaries intact: has 1 ovary.  ?Hysterectomy: no.  ?Menopausal status: postmenopausal. -57 ?HRT use: 0 years. ?Colonoscopy: yes; normal. ?Mammogram within the last year: yes. ?Number of breast biopsies: 0. ? ?Past Medical History:  ?Diagnosis Date  ? Allergic rhinitis   ? Atherosclerosis of abdominal aorta ([place])   ? Atypical nevus   ? Bursitis of right shoulder   ? Cervical radiculitis   ? Cervicalgia   ? Degenerative joint disease   ? Diabetes mellitus without complication ([place])   ? Diverticulosis   ? Dysphagia, oropharyngeal   ? Elevated ferritin   ? Family history of colon cancer   ? Family history of colonic polyps   ? Family history of gastric cancer   ? Family history of leukemia   ? Family history of lung cancer   ? Family history of lung cancer   ? Family history of ovarian cancer   ? Family history of ovarian cancer   ? Family history of prostate cancer   ? Frequent falls   ? Generalized anxiety disorder   ? GERD (gastroesophageal reflux disease)   ? Goiter   ?  History of angina   ? History of esophageal stricture   ? had esophageal stricture dilated 2002  ? Hyperlipidemia   ? Hypomagnesemia   ? Internal hemorrhoid   ? Lymphadenopathy   ? Muscle cramp   ? Obesity   ? Peripheral neuropathy   ? Stress incontinence   ? Tinnitus, bilateral   ? Vertigo   ? ? ?Past Surgical History:  ?Procedure Laterality Date  ? CHOLECYSTECTOMY    ? COLONOSCOPY  2000  ? COLONOSCOPY  2005  ? COLONOSCOPY  07/2011  ? COLONOSCOPY  11/04/2019  ? FHCC/FHCP/Diverticulosis/Internal hemorrhoids/Otherwise normal - 5 year repeat per [facility].  ? ESOPHAGEAL DILATION  2002  ? ESOPHAGOGASTRODUODENOSCOPY  12/1998  ? ESOPHAGOGASTRODUODENOSCOPY  08/2004  ? ESOPHAGOGASTRODUODENOSCOPY  07/27/2011  ? LAPAROSCOPIC TUBAL LIGATION  1994  ? TONSILLECTOMY AND ADENOIDECTOMY    ? ? ?Social History  ? ?Socioeconomic History  ? Marital status: Single  ?  Spouse name: Not on file  ? Number of children: Not on file  ? Years of education: Not on file  ? Highest education level: Not on file  ?Occupational History  ? Not on file  ?Tobacco Use  ? Smoking status: Never  ?  Passive exposure: Never  ? Smokeless tobacco:  Never  ?Vaping Use  ? Vaping Use: Never used  ?Substance and Sexual Activity  ? Alcohol use: No  ? Drug use: Never  ? Sexual activity: Not on file  ?Other Topics Concern  ? Not on file  ?Social History Narrative  ? Not on file  ? ?Social Determinants of Health  ? ?Financial Resource Strain: Not on file  ?Food Insecurity: Not on file  ?Transportation Needs: Not on file  ?Physical Activity: Not on file  ?Stress: Not on file  ?Social Connections: Not on file  ?  ? ?FAMILY HISTORY:  ?We obtained a detailed, 4-generation family history.  Significant diagnoses are listed below: ?Family History  ?Problem Relation Age of Onset  ? Lung cancer Mother   ?     d. 98s  ? Hypertension Father   ? Sickle cell anemia Sister   ? Hypertension Sister   ? Diabetes Sister   ? Leukemia Sister   ?     d. 76s  ? Ovarian cancer Sister   ?     d.  16s  ? Colon polyps Brother   ? Lung cancer Brother   ?     d. 39s  ? Hypertension Brother   ? Lung cancer Brother   ?     d. 82s  ? Colon polyps Brother   ? Prostate cancer Brother   ?     dx 14s  ? Colon polyps Brother   ? Splenomegaly Brother   ?     mets to spleen  ? Dementia Brother   ? Colon polyps Brother   ? Ulcers Brother   ? Prostate cancer Brother   ?     dx 16  ? Cancer Maternal Aunt   ?     unk types  ? Cancer Maternal Uncle   ?     unk type  ? Cancer Cousin   ?     unknown type of cancer  ? ?Kayla Love has 1 daughter (86) and 2 sons (23 and 68). She has grandchildren and great-grandchildren. None have had cancer. She had 4 sisters and 4 brothers. Two brothers passed of lung cancer in their 7s. Two brothers that are living have had prostate cancer, one currently has it at 72 and the other had it in in his 44s. One sister had ovarian cancer and passed in her 59s. Another sister had leukemia and passed in her 81s.  ? ?Kayla Love's mother died in her 6s and had lung cancer. Patient had 2 maternal aunts and 1 uncle, they all had cancer but she is unsure the types. Some of their children also had cancer, unsure types. No information about maternal grandparents. ? ?Kayla Love's father died in his early 22s of heart issues. Patient had 28 paternal aunts/uncles but she does not have information about their health or her paternal grandparents.  ? ?Kayla Love is unaware of previous family history of genetic testing for hereditary cancer risks.There is no reported Ashkenazi Jewish ancestry. There is no known consanguinity. ? ? ? ?GENETIC COUNSELING ASSESSMENT: Kayla Love is a 71 y.o. female with a family history which is somewhat suggestive of a hereditary cancer syndrome and predisposition to cancer. We, therefore, discussed and recommended the following at today's visit.  ? ?DISCUSSION: We discussed that approximately 10% of cancer is hereditary. Most cases of hereditary prostate/ovarian cancer are associated with  BRCA1/BRCA2 genes, although there are other genes associated with hereditary cancer as well. Cancers and risks  are gene specific.  We discussed that testing is beneficial for several reasons including knowing about cancer risks, identifying potential screening and risk-reduction options that may be appropriate, and to understand if other family members could be at risk for cancer and allow them to undergo genetic testing.  ? ?We reviewed the characteristics, features and inheritance patterns of hereditary cancer syndromes. We also discussed genetic testing, including the appropriate family members to test, the process of testing, insurance coverage and turn-around-time for results. We discussed the implications of a negative, positive and/or variant of uncertain significant result. We recommended Kayla Love pursue genetic testing for the [facility] Common Hereditary Cancers+RNA gene panel.  ? ?Based on Kayla Love's family history of cancer, she meets medical criteria for genetic testing. Despite that she meets criteria, she may still have an out of pocket cost. We discussed that if her out of pocket cost for testing is over $100, the laboratory will call and confirm whether she wants to proceed with testing.  If the out of pocket cost of testing is less than $100 she will be billed by the genetic testing laboratory.  ? ?PLAN: After considering the risks, benefits, and limitations, Kayla Love provided informed consent to pursue genetic testing and the blood sample was sent to [facility] for analysis of the Common Hereditary Cancers+RNA panel. Results should be available within approximately 2-3 weeks' time, at which point they will be disclosed by telephone to Kayla Love, as will any additional recommendations warranted by these results. Kayla Love will receive a summary of her genetic counseling visit and a copy of her results once available. This information will also be available in Epic.  ? ?Kayla Love's  questions were answered to her satisfaction today. Our contact information was provided should additional questions or concerns arise. Thank you for the referral and allowing [place] to share in the care of your

## 2022-01-19 ENCOUNTER — Encounter: Payer: Self-pay | Admitting: Oncology

## 2022-01-19 ENCOUNTER — Inpatient Hospital Stay: Payer: Medicare HMO | Admitting: Oncology

## 2022-01-19 VITALS — BP 128/81 | HR 98 | Temp 97.4°F | Wt 216.7 lb

## 2022-01-19 DIAGNOSIS — R7989 Other specified abnormal findings of blood chemistry: Secondary | ICD-10-CM

## 2022-01-19 DIAGNOSIS — R768 Other specified abnormal immunological findings in serum: Secondary | ICD-10-CM

## 2022-01-19 DIAGNOSIS — M2550 Pain in unspecified joint: Secondary | ICD-10-CM | POA: Diagnosis not present

## 2022-01-19 NOTE — Progress Notes (Signed)
?Hematology/Oncology progress note ?[phone] [phone] ?  ? ?   ? ? ?Patient Care Team: ?Bunnie Pion, FNP as PCP - General (Family Medicine) ?Earlie Server, MD as Consulting Physician (Hematology and Oncology) ?Ok Edwards, NP as Nurse Practitioner (Gastroenterology) ? ?REFERRING PROVIDER: ?Bunnie Pion, FNP  ?CHIEF COMPLAINTS/REASON FOR VISIT:  ?Evaluation of elevated ferritin ? ?HISTORY OF PRESENTING ILLNESS:  ? ?Kayla Love is a  71 y.o.  female with PMH listed below was seen in consultation at the request of  Bunnie Pion, FNP  for evaluation of elevated ferritin ? ?Reviewed her blood work obtained by pcp.  ?11/17/21  ?iron 284, sat 29, ferritin 279 ? ?Patient denies alcohol use, family history of iron overload. ?+ chronic joint pain, neck pain, right arm pain, some morning stiffness.  ?Denies weight loss, fever, chills, fatigue, night sweats.   ? ?Significant family history of cancer.  ? ?INTERVAL HISTORY ?Kayla Love is a 71 y.o. female who has above history reviewed by me today presents for follow up visit to review results.  ?No new complaint.  ? ?Review of Systems  ?Constitutional:  Positive for fatigue. Negative for appetite change, chills and fever.  ?HENT:   Negative for hearing loss and voice change.   ?Eyes:  Negative for eye problems.  ?Respiratory:  Negative for chest tightness and cough.   ?Cardiovascular:  Negative for chest pain.  ?Gastrointestinal:  Negative for abdominal distention, abdominal pain and blood in stool.  ?Endocrine: Negative for hot flashes.  ?Genitourinary:  Negative for difficulty urinating and frequency.   ?Musculoskeletal:  Positive for arthralgias and neck pain.  ?Skin:  Negative for itching and rash.  ?Neurological:  Negative for extremity weakness.  ?Hematological:  Negative for adenopathy.  ?Psychiatric/Behavioral:  Negative for confusion.   ? ?MEDICAL HISTORY:  ?Past Medical History:  ?Diagnosis Date  ? Allergic rhinitis    ? Atherosclerosis of abdominal aorta ([place])   ? Atypical nevus   ? Bursitis of right shoulder   ? Cervical radiculitis   ? Cervicalgia   ? Degenerative joint disease   ? Diabetes mellitus without complication ([place])   ? Diverticulosis   ? Dysphagia, oropharyngeal   ? Elevated ferritin   ? Family history of colon cancer   ? Family history of colonic polyps   ? Family history of gastric cancer   ? Family history of leukemia   ? Family history of lung cancer   ? Family history of lung cancer   ? Family history of ovarian cancer   ? Family history of ovarian cancer   ? Family history of prostate cancer   ? Frequent falls   ? Generalized anxiety disorder   ? GERD (gastroesophageal reflux disease)   ? Goiter   ? History of angina   ? History of esophageal stricture   ? had esophageal stricture dilated 2002  ? Hyperlipidemia   ? Hypomagnesemia   ? Internal hemorrhoid   ? Lymphadenopathy   ? Muscle cramp   ? Obesity   ? Peripheral neuropathy   ? Stress incontinence   ? Tinnitus, bilateral   ? Vertigo   ? ? ?SURGICAL HISTORY: ?Past Surgical History:  ?Procedure Laterality Date  ? CHOLECYSTECTOMY    ? COLONOSCOPY  2000  ? COLONOSCOPY  2005  ? COLONOSCOPY  07/2011  ? COLONOSCOPY  11/04/2019  ? FHCC/FHCP/Diverticulosis/Internal hemorrhoids/Otherwise normal - 5 year repeat per [facility].  ? ESOPHAGEAL DILATION  2002  ? ESOPHAGOGASTRODUODENOSCOPY  12/1998  ? ESOPHAGOGASTRODUODENOSCOPY  08/2004  ? ESOPHAGOGASTRODUODENOSCOPY  07/27/2011  ? LAPAROSCOPIC TUBAL LIGATION  1994  ? TONSILLECTOMY AND ADENOIDECTOMY    ? ? ?SOCIAL HISTORY: ?Social History  ? ?Socioeconomic History  ? Marital status: Single  ?  Spouse name: Not on file  ? Number of children: Not on file  ? Years of education: Not on file  ? Highest education level: Not on file  ?Occupational History  ? Not on file  ?Tobacco Use  ? Smoking status: Never  ?  Passive exposure: Never  ? Smokeless tobacco: Never  ?Vaping Use  ? Vaping Use: Never used  ?Substance and Sexual Activity  ?  Alcohol use: No  ? Drug use: Never  ? Sexual activity: Not on file  ?Other Topics Concern  ? Not on file  ?Social History Narrative  ? Not on file  ? ?Social Determinants of Health  ? ?Financial Resource Strain: Not on file  ?Food Insecurity: Not on file  ?Transportation Needs: Not on file  ?Physical Activity: Not on file  ?Stress: Not on file  ?Social Connections: Not on file  ?Intimate Partner Violence: Not on file  ? ? ?FAMILY HISTORY: ?Family History  ?Problem Relation Age of Onset  ? Lung cancer Mother   ?     d. 54s  ? Hypertension Father   ? Sickle cell anemia Sister   ? Hypertension Sister   ? Diabetes Sister   ? Leukemia Sister   ?     d. 59s  ? Ovarian cancer Sister   ?     d. 54s  ? Colon polyps Brother   ? Lung cancer Brother   ?     d. 40s  ? Hypertension Brother   ? Lung cancer Brother   ?     d. 85s  ? Colon polyps Brother   ? Prostate cancer Brother   ?     dx 43s  ? Colon polyps Brother   ? Splenomegaly Brother   ?     mets to spleen  ? Dementia Brother   ? Colon polyps Brother   ? Ulcers Brother   ? Prostate cancer Brother   ?     dx 60  ? Cancer Maternal Aunt   ?     unk types  ? Cancer Maternal Uncle   ?     unk type  ? Cancer Cousin   ?     unknown type of cancer  ? ? ?ALLERGIES:  is allergic to other, codeine, and shellfish allergy. ? ?MEDICATIONS:  ?Current Outpatient Medications  ?Medication Sig Dispense Refill  ? aspirin 81 MG chewable tablet Chew 1 tablet by mouth daily.    ? baclofen (LIORESAL) 10 MG tablet Take 10 mg by mouth 2 (two) times daily.    ? cetirizine (ZYRTEC) 10 MG tablet Take 10 mg by mouth daily.    ? ezetimibe (ZETIA) 10 MG tablet Take 1 tablet by mouth daily.    ? famotidine (PEPCID) 20 MG tablet Take 20 mg by mouth daily.    ? gabapentin (NEURONTIN) 300 MG capsule Take 1 capsule by mouth 2 (two) times daily.    ? JARDIANCE 10 MG TABS tablet Take 10 mg by mouth daily.    ? lisinopril (ZESTRIL) 2.5 MG tablet Take 1 tablet by mouth daily.    ? magnesium oxide (MAG-OX) 400 MG  tablet Take 1 tablet by mouth daily.    ? meclizine (ANTIVERT) 25  MG tablet Take 1 tablet by mouth every 6 (six) hours as needed for dizziness.    ? meloxicam (MOBIC) 15 MG tablet Take 1 tablet by mouth daily.    ? metFORMIN (GLUCOPHAGE) 1000 MG tablet Take 1,000 mg by mouth 2 (two) times daily.    ? omeprazole (PRILOSEC) 40 MG capsule Take 1 capsule by mouth daily.    ? polyethylene glycol powder (GLYCOLAX/MIRALAX) 17 GM/SCOOP powder Take as directed for colonic prep.    ? traZODone (DESYREL) 50 MG tablet Take 1 tablet by mouth at bedtime as needed for sleep.    ? Magnesium Oxide 250 MG TABS Take 1 tablet by mouth daily. (Patient not taking: Reported on 12/22/2021)    ? metFORMIN (GLUCOPHAGE) 500 MG tablet Take 500 mg by mouth 2 (two) times daily with a meal. (Patient not taking: Reported on 12/22/2021)    ? ?No current facility-administered medications for this visit.  ? ? ? ?PHYSICAL EXAMINATION: ?ECOG PERFORMANCE STATUS: 0 - Asymptomatic ?Vitals:  ? 01/19/22 1101  ?BP: 128/81  ?Pulse: 98  ?Temp: (!) 97.4 ?F (36.3 ?C)  ? ?Filed Weights  ? 01/19/22 1101  ?Weight: 216 lb 11.2 oz (98.3 kg)  ? ? ?Physical Exam ?Constitutional:   ?   General: She is not in acute distress. ?   Appearance: She is obese.  ?HENT:  ?   Head: Normocephalic and atraumatic.  ?Eyes:  ?   General: No scleral icterus. ?Cardiovascular:  ?   Rate and Rhythm: Normal rate and regular rhythm.  ?   Heart sounds: Normal heart sounds.  ?Pulmonary:  ?   Effort: Pulmonary effort is normal. No respiratory distress.  ?   Breath sounds: No wheezing.  ?Abdominal:  ?   General: Bowel sounds are normal. There is no distension.  ?   Palpations: Abdomen is soft.  ?Musculoskeletal:     ?   General: No deformity. Normal range of motion.  ?   Cervical back: Normal range of motion and neck supple.  ?Skin: ?   General: Skin is warm and dry.  ?   Findings: No erythema or rash.  ?Neurological:  ?   Mental Status: She is alert and oriented to person, place, and time. Mental  status is at baseline.  ?   Cranial Nerves: No cranial nerve deficit.  ?   Coordination: Coordination normal.  ?Psychiatric:     ?   Mood and Affect: Mood normal.  ? ? ?LABORATORY DATA:  ?I have revi

## 2022-01-23 ENCOUNTER — Telehealth: Payer: Self-pay | Admitting: Licensed Clinical Social Worker

## 2022-01-23 ENCOUNTER — Ambulatory Visit: Payer: Self-pay | Admitting: Licensed Clinical Social Worker

## 2022-01-23 ENCOUNTER — Encounter: Payer: Self-pay | Admitting: Licensed Clinical Social Worker

## 2022-01-23 DIAGNOSIS — Z1379 Encounter for other screening for genetic and chromosomal anomalies: Secondary | ICD-10-CM | POA: Insufficient documentation

## 2022-01-23 NOTE — Progress Notes (Signed)
HPI:  Kayla Love was previously seen in the [facility] clinic due to a family history of cancer and concerns regarding a hereditary predisposition to cancer. Please refer to our prior cancer genetics clinic note for more information regarding our discussion, assessment and recommendations, at the time. Kayla Love recent genetic test results were disclosed to her, as were recommendations warranted by these results. These results and recommendations are discussed in more detail below. ? ?CANCER HISTORY:  ?Oncology History  ? No history exists.  ? ? ?FAMILY HISTORY:  ?We obtained a detailed, 4-generation family history.  Significant diagnoses are listed below: ?Family History  ?Problem Relation Age of Onset  ? Lung cancer Mother   ?     d. 52s  ? Hypertension Father   ? Sickle cell anemia Sister   ? Hypertension Sister   ? Diabetes Sister   ? Leukemia Sister   ?     d. 72s  ? Ovarian cancer Sister   ?     d. 49s  ? Colon polyps Brother   ? Lung cancer Brother   ?     d. 32s  ? Hypertension Brother   ? Lung cancer Brother   ?     d. 71s  ? Colon polyps Brother   ? Prostate cancer Brother   ?     dx 29s  ? Colon polyps Brother   ? Splenomegaly Brother   ?     mets to spleen  ? Dementia Brother   ? Colon polyps Brother   ? Ulcers Brother   ? Prostate cancer Brother   ?     dx 73  ? Cancer Maternal Aunt   ?     unk types  ? Cancer Maternal Uncle   ?     unk type  ? Cancer Cousin   ?     unknown type of cancer  ? ?Kayla Love has 1 daughter (28) and 2 sons (14 and 4). She has grandchildren and great-grandchildren. None have had cancer. She had 4 sisters and 4 brothers. Two brothers passed of lung cancer in their 72s. Two brothers that are living have had prostate cancer, one currently has it at 78 and the other had it in in his 70s. One sister had ovarian cancer and passed in her 69s. Another sister had leukemia and passed in her 57s.  ?  ?Kayla Love's mother died in her 64s and had lung cancer. Patient had 2  maternal aunts and 1 uncle, they all had cancer but she is unsure the types. Some of their children also had cancer, unsure types. No information about maternal grandparents. ?  ?Kayla Love's father died in his early 23s of heart issues. Patient had 28 paternal aunts/uncles but she does not have information about their health or her paternal grandparents.  ?  ?Kayla Love is unaware of previous family history of genetic testing for hereditary cancer risks.There is no reported Ashkenazi Jewish ancestry. There is no known consanguinity. ?  ?  ? ? ?GENETIC TEST RESULTS: Genetic testing reported out on 01/20/2022 through the [facility] Common Hereditary Cancer+RNA cancer panel found no pathogenic mutations.  ? ?The Common Hereditary Cancers Panel + RNA offered by [facility] includes sequencing and/or deletion duplication testing of the following 47 genes: APC, ATM, AXIN2, BARD1, BMPR1A, BRCA1, BRCA2, BRIP1, CDH1, CDKN2A (p14ARF), CDKN2A (p16INK4a), CKD4, CHEK2, CTNNA1, DICER1, EPCAM (Deletion/duplication testing only), GREM1 (promoter region deletion/duplication testing only), KIT, MEN1, MLH1, MSH2,  MSH3, MSH6, MUTYH, NBN, NF1, NHTL1, PALB2, PDGFRA, PMS2, POLD1, POLE, PTEN, RAD50, RAD51C, RAD51D, SDHB, SDHC, SDHD, SMAD4, SMARCA4. STK11, TP53, TSC1, TSC2, and VHL.  The following genes were evaluated for sequence changes only: SDHA and HOXB13 c.251G>A variant only.  ? ?The test report has been scanned into EPIC and is located under the Molecular Pathology section of the Results Review tab.  A portion of the result report is included below for reference.  ? ? ? ?We discussed that because current genetic testing is not perfect, it is possible there may be a gene mutation in one of these genes that current testing cannot detect, but that chance is small.  There could be another gene that has not yet been discovered, or that we have not yet tested, that is responsible for the cancer diagnoses in the family. It is also possible there is  a hereditary cause for the cancer in the family that Kayla Love did not inherit and therefore was not identified in her testing.  Therefore, it is important to remain in touch with cancer genetics in the future so that we can continue to offer Kayla Love the most up to date genetic testing.  ? ?ADDITIONAL GENETIC TESTING: We discussed with Kayla Love that her genetic testing was fairly extensive.  If there are genes identified to increase cancer risk that can be analyzed in the future, we would be happy to discuss and coordinate this testing at that time.   ? ?CANCER SCREENING RECOMMENDATIONS: Kayla Love test result is considered negative (normal).  This means that we have not identified a hereditary cause for her family history of cancer at this time.  ? ?While reassuring, this does not definitively rule out a hereditary predisposition to cancer. It is still possible that there could be genetic mutations that are undetectable by current technology. There could be genetic mutations in genes that have not been tested or identified to increase cancer risk.  Therefore, it is recommended she continue to follow the cancer management and screening guidelines provided by her primary healthcare provider.  ? ?An individual's cancer risk and medical management are not determined by genetic test results alone. Overall cancer risk assessment incorporates additional factors, including personal medical history, family history, and any available genetic information that may result in a personalized plan for cancer prevention and surveillance. ? ?RECOMMENDATIONS FOR FAMILY MEMBERS:  Relatives in this family might be at some increased risk of developing cancer, over the general population risk, simply due to the family history of cancer.  We recommended female relatives in this family have a yearly mammogram beginning at age 69, or 19 years younger than the earliest onset of cancer, an annual clinical breast exam, and perform monthly  breast self-exams. Female relatives in this family should also have a gynecological exam as recommended by their primary provider.  All family members should be referred for colonoscopy starting at age 46.  ? ? It is also possible there is a hereditary cause for the cancer in Kayla Love's family that she did not inherit and therefore was not identified in her.  Based on Kayla Love's family history, we recommended those related to her sister who had ovarian cancer/brothers who had prostate cancer have genetic counseling and testing. Kayla Love will let us know if we can be of any assistance in coordinating genetic counseling and/or testing for these family members. ? ?FOLLOW-UP: Lastly, we discussed with Kayla Love that cancer genetics is a rapidly advancing field and  it is possible that new genetic tests will be appropriate for her and/or her family members in the future. We encouraged her to remain in contact with cancer genetics on an annual basis so we can update her personal and family histories and let her know of advances in cancer genetics that may benefit this family.  ? ?Our contact number was provided. Ms. Wiles questions were answered to her satisfaction, and she knows she is welcome to call us at anytime with additional questions or concerns.  ? ?Faith Rogue, MS, LCGC ?Genetic Counselor ?[email or web address] ?Phone: [phone] ? ? ?

## 2022-01-23 NOTE — Telephone Encounter (Signed)
Revealed negative genetic testing.  This normal result is reassuring.  It is unlikely that there is an increased risk of cancer due to a mutation in one of these genes.  However, genetic testing is not perfect, and cannot definitively rule out a hereditary cause.  It will be important for her to keep in contact with genetics to learn if any additional testing may be needed in the future.      

## 2022-01-26 ENCOUNTER — Ambulatory Visit
Admission: RE | Admit: 2022-01-26 | Discharge: 2022-01-26 | Disposition: A | Discharge status: Home / Self Care | Payer: Medicare HMO | Source: Ambulatory Visit | Attending: Oncology | Admitting: Oncology

## 2022-01-26 DIAGNOSIS — R768 Other specified abnormal immunological findings in serum: Secondary | ICD-10-CM | POA: Diagnosis present

## 2022-01-27 ENCOUNTER — Other Ambulatory Visit: Payer: Self-pay

## 2022-01-27 ENCOUNTER — Telehealth: Payer: Self-pay | Admitting: *Deleted

## 2022-01-27 DIAGNOSIS — K769 Liver disease, unspecified: Secondary | ICD-10-CM

## 2022-01-27 NOTE — Telephone Encounter (Signed)
Called report ? ?IMPRESSION: ?1. Possible 2.5 cm subtle mass in the left hepatic lobe. Recommend ?MRI with and without contrast for further evaluation. ?2. Diffuse increased echogenicity throughout the liver is ?nonspecific but often seen with hepatic steatosis. ?3. Common bile duct prominence is consistent with previous ?cholecystectomy. ?  ?These results will be called to the ordering clinician or ?representative by the Radiologist Assistant, and communication ?documented in the PACS or [employer]. ?  ?  ?Electronically Signed ?  By: Dorise Bullion III M.D. ?  On: 01/26/2022 18:30 ?  ?

## 2022-01-27 NOTE — Telephone Encounter (Signed)
Called to inform patient but no answer. Left detailed message referring to Dr. Collie Siad recommendation to have a MRI of liver done to further evaluate. Informed in message I would get this scheduled for her. If she doesn't agree to plan we will cancel.  ? ? ?Please schedule patient for: ?MRI of live w/wo contrast. Next available.Please notify patient of appointment ?

## 2022-01-30 NOTE — Telephone Encounter (Signed)
Per Dr. Tasia Catchings, she will wait for results to further schedule  ?

## 2022-01-31 ENCOUNTER — Other Ambulatory Visit: Payer: Self-pay | Admitting: Oncology

## 2022-01-31 MED ORDER — ALPRAZOLAM 0.5 MG PO TABS
0.5000 mg | ORAL_TABLET | Freq: Every evening | ORAL | 0 refills | Status: AC | PRN
Start: 2022-01-31 — End: ?

## 2022-01-31 NOTE — Telephone Encounter (Signed)
Spoke with patient and discussed her option of Xanax to take 30 min prior to MRI. Advised patient she is not operate vehicle after taking the medication. 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 Patient is aware and  Patient verbalized understanding.  ?

## 2022-02-20 ENCOUNTER — Ambulatory Visit
Admission: RE | Admit: 2022-02-20 | Discharge: 2022-02-20 | Disposition: A | Discharge status: Home / Self Care | Payer: Medicare HMO | Source: Ambulatory Visit | Attending: Oncology | Admitting: Oncology

## 2022-02-20 DIAGNOSIS — K769 Liver disease, unspecified: Secondary | ICD-10-CM | POA: Diagnosis present

## 2022-02-20 MED ORDER — GADOBUTROL 1 MMOL/ML IV SOLN
9.0000 mL | Freq: Once | INTRAVENOUS | Status: AC | PRN
  Administered 2022-02-20: 9 mL via INTRAVENOUS

## 2022-02-28 ENCOUNTER — Telehealth: Payer: Self-pay | Admitting: *Deleted

## 2022-02-28 NOTE — Telephone Encounter (Signed)
I spoke with patient and she states she already has a GI doctor at [facility]. (She was last seen in September) She is going to call them to get an appointment  ?

## 2022-02-28 NOTE — Telephone Encounter (Signed)
Patient called asking for results of MRI done 02/20/22. She has no follow up appointments ? ? ?

## 2022-07-03 ENCOUNTER — Ambulatory Visit
Admission: RE | Admit: 2022-07-03 | Discharge: 2022-07-03 | Disposition: A | Discharge status: Home / Self Care | Payer: Medicare Other | Attending: Gastroenterology | Admitting: Gastroenterology

## 2022-07-03 ENCOUNTER — Ambulatory Visit: Payer: Medicare Other | Admitting: Anesthesiology

## 2022-07-03 ENCOUNTER — Encounter
Admission: RE | Disposition: A | Discharge status: Home / Self Care | Payer: Self-pay | Source: Home / Self Care | Attending: Gastroenterology

## 2022-07-03 DIAGNOSIS — K449 Diaphragmatic hernia without obstruction or gangrene: Secondary | ICD-10-CM | POA: Diagnosis not present

## 2022-07-03 DIAGNOSIS — K219 Gastro-esophageal reflux disease without esophagitis: Secondary | ICD-10-CM | POA: Diagnosis present

## 2022-07-03 DIAGNOSIS — E669 Obesity, unspecified: Secondary | ICD-10-CM | POA: Diagnosis not present

## 2022-07-03 DIAGNOSIS — K2950 Unspecified chronic gastritis without bleeding: Secondary | ICD-10-CM | POA: Diagnosis not present

## 2022-07-03 DIAGNOSIS — Z9049 Acquired absence of other specified parts of digestive tract: Secondary | ICD-10-CM | POA: Insufficient documentation

## 2022-07-03 DIAGNOSIS — Z800 Family history of malignant neoplasm of digestive organs: Secondary | ICD-10-CM | POA: Insufficient documentation

## 2022-07-03 DIAGNOSIS — E119 Type 2 diabetes mellitus without complications: Secondary | ICD-10-CM | POA: Diagnosis not present

## 2022-07-03 HISTORY — PX: ESOPHAGOGASTRODUODENOSCOPY (EGD) WITH PROPOFOL: SHX5813

## 2022-07-03 LAB — GLUCOSE, CAPILLARY: Glucose-Capillary: 114 mg/dL — ABNORMAL HIGH (ref 70–99)

## 2022-07-03 SURGERY — ESOPHAGOGASTRODUODENOSCOPY (EGD) WITH PROPOFOL
Anesthesia: General

## 2022-07-03 MED ORDER — SODIUM CHLORIDE 0.9 % IV SOLN
INTRAVENOUS | Status: DC
  Administered 2022-07-03: 20 mL/h via INTRAVENOUS

## 2022-07-03 MED ORDER — PROPOFOL 10 MG/ML IV BOLUS
INTRAVENOUS | Status: DC | PRN
  Administered 2022-07-03: 70 mg via INTRAVENOUS
  Administered 2022-07-03: 10 mg via INTRAVENOUS

## 2022-07-03 MED ORDER — LIDOCAINE HCL (CARDIAC) PF 100 MG/5ML IV SOSY
PREFILLED_SYRINGE | INTRAVENOUS | Status: DC | PRN
  Administered 2022-07-03: 50 mg via INTRAVENOUS

## 2022-07-03 MED ORDER — PROPOFOL 500 MG/50ML IV EMUL
INTRAVENOUS | Status: DC | PRN
  Administered 2022-07-03: 100 ug/kg/min via INTRAVENOUS

## 2022-07-03 NOTE — Interval H&P Note (Signed)
History and Physical Interval Note:  07/03/2022 10:54 AM  Kayla Love  has presented today for surgery, with the diagnosis of GERD.  The various methods of treatment have been discussed with the patient and family. After consideration of risks, benefits and other options for treatment, the patient has consented to  Procedure(s) with comments: ESOPHAGOGASTRODUODENOSCOPY (EGD) WITH PROPOFOL (N/A) - DM as a surgical intervention.  The patient's history has been reviewed, patient examined, no change in status, stable for surgery.  I have reviewed the patient's chart and labs.  Questions were answered to the patient's satisfaction.     [name]  Ok to proceed with EGD. Not colonoscopy.

## 2022-07-03 NOTE — H&P (Signed)
Outpatient short stay form Pre-procedure 07/03/2022  [name], MD  Primary Physician: Bunnie Pion, FNP  Reason for visit:  GERD  History of present illness:    71 y/o lady with history of DM II, obesity, and GERD here for EGD for worsening GERD. No blood thinners. History of cholecystectomy. Multiple family members with cancer including sister with colon cancer.    Current Facility-Administered Medications:    0.9 %  sodium chloride infusion, , Intravenous, Continuous, Ryo Klang, Hilton Cork, MD, Last Rate: 20 mL/hr at 07/03/22 1052, Continued from Pre-op at 07/03/22 1052  Medications Prior to Admission  Medication Sig Dispense Refill Last Dose   ALPRAZolam (XANAX) 0.5 MG tablet Take 1 tablet (0.5 mg total) by mouth at bedtime as needed for anxiety. 0.5 mg 30 minutes before procedure; if needed, you repeat the dose after 30  minutes 2 tablet 0 07/02/2022   aspirin 81 MG chewable tablet Chew 1 tablet by mouth daily.   07/02/2022   baclofen (LIORESAL) 10 MG tablet Take 10 mg by mouth 2 (two) times daily.   07/02/2022   cetirizine (ZYRTEC) 10 MG tablet Take 10 mg by mouth daily.   07/02/2022   ezetimibe (ZETIA) 10 MG tablet Take 1 tablet by mouth daily.   07/02/2022   famotidine (PEPCID) 20 MG tablet Take 20 mg by mouth daily.   07/02/2022   gabapentin (NEURONTIN) 300 MG capsule Take 1 capsule by mouth 2 (two) times daily.   07/02/2022   JARDIANCE 10 MG TABS tablet Take 10 mg by mouth daily.   07/02/2022   lisinopril (ZESTRIL) 2.5 MG tablet Take 1 tablet by mouth daily.   07/02/2022   magnesium oxide (MAG-OX) 400 MG tablet Take 1 tablet by mouth daily.   07/02/2022   meloxicam (MOBIC) 15 MG tablet Take 1 tablet by mouth daily.   07/02/2022   metFORMIN (GLUCOPHAGE) 1000 MG tablet Take 1,000 mg by mouth 2 (two) times daily.   07/02/2022   omeprazole (PRILOSEC) 40 MG capsule Take 1 capsule by mouth daily.   07/02/2022   traZODone (DESYREL) 50 MG tablet Take 1 tablet by mouth at bedtime as  needed for sleep.   07/02/2022   Magnesium Oxide 250 MG TABS Take 1 tablet by mouth daily. (Patient not taking: Reported on 12/22/2021)      meclizine (ANTIVERT) 25 MG tablet Take 1 tablet by mouth every 6 (six) hours as needed for dizziness.      metFORMIN (GLUCOPHAGE) 500 MG tablet Take 500 mg by mouth 2 (two) times daily with a meal. (Patient not taking: Reported on 12/22/2021)      polyethylene glycol powder (GLYCOLAX/MIRALAX) 17 GM/SCOOP powder Take as directed for colonic prep.        Allergies  Allergen Reactions   Other Hives and Shortness Of Breath    Shrimp   Codeine    Shellfish Allergy      Past Medical History:  Diagnosis Date   Allergic rhinitis    Atherosclerosis of abdominal aorta ([facility])    Atypical nevus    Bursitis of right shoulder    Cervical radiculitis    Cervicalgia    Degenerative joint disease    Diabetes mellitus without complication ([facility])    Diverticulosis    Dysphagia, oropharyngeal    Elevated ferritin    Family history of colon cancer    Family history of colonic polyps    Family history of gastric cancer    Family history of leukemia  Family history of lung cancer    Family history of lung cancer    Family history of ovarian cancer    Family history of ovarian cancer    Family history of prostate cancer    Frequent falls    Generalized anxiety disorder    GERD (gastroesophageal reflux disease)    Goiter    History of angina    History of esophageal stricture    had esophageal stricture dilated 2002   Hyperlipidemia    Hypomagnesemia    Internal hemorrhoid    Lymphadenopathy    Muscle cramp    Obesity    Peripheral neuropathy    Stress incontinence    Tinnitus, bilateral    Vertigo     Review of systems:  Otherwise negative.    Physical Exam  Gen: Alert, oriented. Appears stated age.  HEENT:PERRLA. Lungs: No respiratory distress CV: RRR Abd: soft, benign, no masses Ext: No edema    Planned procedures: Proceed with EGD.  The patient understands the nature of the planned procedure, indications, risks, alternatives and potential complications including but not limited to bleeding, infection, perforation, damage to internal organs and possible oversedation/side effects from anesthesia. The patient agrees and gives consent to proceed.  Please refer to procedure notes for findings, recommendations and patient disposition/instructions.     [name], MD [facility]

## 2022-07-03 NOTE — Interval H&P Note (Signed)
History and Physical Interval Note:  07/03/2022 10:54 AM  Kayla Love  has presented today for surgery, with the diagnosis of GERD.  The various methods of treatment have been discussed with the patient and family. After consideration of risks, benefits and other options for treatment, the patient has consented to  Procedure(s) with comments: ESOPHAGOGASTRODUODENOSCOPY (EGD) WITH PROPOFOL (N/A) - DM as a surgical intervention.  The patient's history has been reviewed, patient examined, no change in status, stable for surgery.  I have reviewed the patient's chart and labs.  Questions were answered to the patient's satisfaction.     [name]  Ok to proceed with colonoscopy

## 2022-07-03 NOTE — Anesthesia Preprocedure Evaluation (Addendum)
Anesthesia Evaluation  Patient identified by MRN, date of birth, ID band Patient awake    Reviewed: Allergy & Precautions, NPO status , Patient's Chart, lab work & pertinent test results  Airway Mallampati: III  TM Distance: >3 FB Neck ROM: full    Dental  (+) Chipped   Pulmonary neg pulmonary ROS,    Pulmonary exam normal        Cardiovascular Exercise Tolerance: Good + DOE  Normal cardiovascular exam     Neuro/Psych PSYCHIATRIC DISORDERS Anxiety Peripheral neuropathy  Neuromuscular disease    GI/Hepatic Neg liver ROS, GERD  Medicated,History of esophageal stricture   Endo/Other  diabetes, Type 2Goiter  Renal/GU negative Renal ROS  negative genitourinary   Musculoskeletal   Abdominal (+) + obese,   Peds  Hematology negative hematology ROS (+)   Anesthesia Other Findings Past Medical History: No date: Allergic rhinitis No date: Atherosclerosis of abdominal aorta (HCC) No date: Atypical nevus No date: Bursitis of right shoulder No date: Cervical radiculitis No date: Cervicalgia No date: Degenerative joint disease No date: Diabetes mellitus without complication (HCC) No date: Diverticulosis No date: Dysphagia, oropharyngeal No date: Elevated ferritin No date: Family history of colon cancer No date: Family history of colonic polyps No date: Family history of gastric cancer No date: Family history of leukemia No date: Family history of lung cancer No date: Family history of lung cancer No date: Family history of ovarian cancer No date: Family history of ovarian cancer No date: Family history of prostate cancer No date: Frequent falls No date: Generalized anxiety disorder No date: GERD (gastroesophageal reflux disease) No date: Goiter No date: History of angina No date: History of esophageal stricture     Comment:  had esophageal stricture dilated 2002 No date: Hyperlipidemia No date:  Hypomagnesemia No date: Internal hemorrhoid No date: Lymphadenopathy No date: Muscle cramp No date: Obesity No date: Peripheral neuropathy No date: Stress incontinence No date: Tinnitus, bilateral No date: Vertigo  Past Surgical History: No date: CHOLECYSTECTOMY 2000: COLONOSCOPY 2005: COLONOSCOPY 07/2011: COLONOSCOPY 11/04/2019: COLONOSCOPY     Comment:  FHCC/FHCP/Diverticulosis/Internal hemorrhoids/Otherwise               normal - 5 year repeat per [facility]. 2002: ESOPHAGEAL DILATION 12/1998: ESOPHAGOGASTRODUODENOSCOPY 08/2004: ESOPHAGOGASTRODUODENOSCOPY 07/27/2011: ESOPHAGOGASTRODUODENOSCOPY 1994: LAPAROSCOPIC TUBAL LIGATION No date: TONSILLECTOMY AND ADENOIDECTOMY     Reproductive/Obstetrics negative OB ROS                            Anesthesia Physical Anesthesia Plan  ASA: 2  Anesthesia Plan: General   Post-op Pain Management: Minimal or no pain anticipated   Induction: Intravenous  PONV Risk Score and Plan: Propofol infusion and TIVA  Airway Management Planned: Natural Airway and Nasal Cannula  Additional Equipment:   Intra-op Plan:   Post-operative Plan:   Informed Consent: I have reviewed the patients History and Physical, chart, labs and discussed the procedure including the risks, benefits and alternatives for the proposed anesthesia with the patient or authorized representative who has indicated his/her understanding and acceptance.     Dental Advisory Given  Plan Discussed with: Anesthesiologist, CRNA and Surgeon  Anesthesia Plan Comments:        Anesthesia Quick Evaluation

## 2022-07-03 NOTE — Transfer of Care (Signed)
Immediate Anesthesia Transfer of Care Note  Patient: Kayla Love  Procedure(s) Performed: ESOPHAGOGASTRODUODENOSCOPY (EGD) WITH PROPOFOL  Patient Location: Endoscopy Unit  Anesthesia Type:General  Level of Consciousness: awake, alert  and oriented  Airway & Oxygen Therapy: Patient Spontanous Breathing  Post-op Assessment: Report given to RN and Post -op Vital signs reviewed and stable  Post vital signs: Reviewed and stable  Last Vitals:  Vitals Value Taken Time  BP 125/89 07/03/22 1110  Temp 35.6 C 07/03/22 1109  Pulse 90 07/03/22 1110  Resp 34 07/03/22 1110  SpO2 100 % 07/03/22 1110  Vitals shown include unvalidated device data.  Last Pain:  Vitals:   07/03/22 1109  TempSrc: Temporal  PainSc: 0-No pain         Complications: No notable events documented.

## 2022-07-03 NOTE — Anesthesia Procedure Notes (Signed)
Procedure Name: MAC Date/Time: 07/03/2022 10:52 AM  Performed by: Tollie Eth, CRNAPre-anesthesia Checklist: Patient identified, Emergency Drugs available, Suction available and Patient being monitored Patient Re-evaluated:Patient Re-evaluated prior to induction Oxygen Delivery Method: Simple face mask Induction Type: IV induction Placement Confirmation: positive ETCO2

## 2022-07-03 NOTE — Anesthesia Postprocedure Evaluation (Signed)
Anesthesia Post Note  Patient: Kayla Love  Procedure(s) Performed: ESOPHAGOGASTRODUODENOSCOPY (EGD) WITH PROPOFOL  Patient location during evaluation: Endoscopy Anesthesia Type: General Level of consciousness: awake and alert Pain management: pain level controlled Vital Signs Assessment: post-procedure vital signs reviewed and stable Respiratory status: spontaneous breathing, nonlabored ventilation and respiratory function stable Cardiovascular status: blood pressure returned to baseline and stable Postop Assessment: no apparent nausea or vomiting Anesthetic complications: no   No notable events documented.   Last Vitals:  Vitals:   07/03/22 1109 07/03/22 1129  BP: 125/89 (!) 138/93  Pulse:    Resp: (!) 31   Temp: (!) 35.6 C   SpO2:      Last Pain:  Vitals:   07/03/22 1129  TempSrc:   PainSc: 0-No pain                 [place] Ouch

## 2022-07-03 NOTE — Op Note (Signed)
[facility] Patient Name: Kayla Love Procedure Date: 07/03/2022 10:53 AM MRN: [id number] Account #: [id number] Date of Birth: 04-06-1951 Admit Type: Outpatient Age: 71 Room: [facility] ENDO ROOM 3 Gender: Female Note Status: Finalized Instrument Name: Upper Endoscope [id number] Procedure:             Upper GI endoscopy Indications:           Gastro-esophageal reflux disease Providers:             Andrey Farmer MD, MD Referring MD:          No Local Md, MD (Referring MD) Medicines:             Monitored Anesthesia Care Complications:         No immediate complications. Estimated blood loss:                         Minimal. Procedure:             Pre-Anesthesia Assessment:                        - Prior to the procedure, a History and Physical was                         performed, and patient medications and allergies were                         reviewed. The patient is competent. The risks and                         benefits of the procedure and the sedation options and                         risks were discussed with the patient. All questions                         were answered and informed consent was obtained.                         Patient identification and proposed procedure were                         verified by the physician, the nurse, the                         anesthesiologist, the anesthetist and the technician                         in the endoscopy suite. Mental Status Examination:                         alert and oriented. Airway Examination: normal                         oropharyngeal airway and neck mobility. Respiratory                         Examination: clear to auscultation. CV Examination:  normal. Prophylactic Antibiotics: The patient does not                         require prophylactic antibiotics. Prior                         Anticoagulants: The patient has taken no previous                          anticoagulant or antiplatelet agents. ASA Grade                         Assessment: II - A patient with mild systemic disease.                         After reviewing the risks and benefits, the patient                         was deemed in satisfactory condition to undergo the                         procedure. The anesthesia plan was to use monitored                         anesthesia care (MAC). Immediately prior to                         administration of medications, the patient was                         re-assessed for adequacy to receive sedatives. The                         heart rate, respiratory rate, oxygen saturations,                         blood pressure, adequacy of pulmonary ventilation, and                         response to care were monitored throughout the                         procedure. The physical status of the patient was                         re-assessed after the procedure.                        After obtaining informed consent, the endoscope was                         passed under direct vision. Throughout the procedure,                         the patient's blood pressure, pulse, and oxygen                         saturations were monitored continuously. The Endoscope  was introduced through the mouth, and advanced to the                         second part of duodenum. The upper GI endoscopy was                         accomplished without difficulty. The patient tolerated                         the procedure well. Findings:      A small hiatal hernia was present.      The examined esophagus was normal.      The entire examined stomach was normal. Biopsies were taken with a cold       forceps for histology. Estimated blood loss was minimal.      The examined duodenum was normal. Impression:            - Small hiatal hernia.                        - Normal esophagus.                        - Normal stomach.  Biopsied.                        - Normal examined duodenum. Recommendation:        - Discharge patient to home.                        - Resume previous diet.                        - Continue present medications.                        - Await pathology results.                        - Return to referring physician as previously                         scheduled. Procedure Code(s):     --- Professional ---                        [phone], Esophagogastroduodenoscopy, flexible,                         transoral; with biopsy, single or multiple Diagnosis Code(s):     --- Professional ---                        K44.9, Diaphragmatic hernia without obstruction or                         gangrene                        K21.9, Gastro-esophageal reflux disease without                         esophagitis CPT copyright 2019 American Medical Association. All rights reserved. The codes  documented in this report are preliminary and upon coder review may  be revised to meet current compliance requirements. Andrey Farmer MD, MD 07/03/2022 11:09:54 AM Number of Addenda: 0 Note Initiated On: 07/03/2022 10:53 AM Estimated Blood Loss:  Estimated blood loss was minimal.      [facility]

## 2022-07-04 ENCOUNTER — Encounter: Payer: Self-pay | Admitting: Gastroenterology

## 2022-07-05 LAB — SURGICAL PATHOLOGY

## 2023-03-25 ENCOUNTER — Ambulatory Visit
Admission: EM | Admit: 2023-03-25 | Discharge: 2023-03-25 | Disposition: A | Discharge status: Home / Self Care | Payer: Medicare Other | Attending: Family Medicine | Admitting: Family Medicine

## 2023-03-25 ENCOUNTER — Encounter: Payer: Self-pay | Admitting: Emergency Medicine

## 2023-03-25 DIAGNOSIS — J069 Acute upper respiratory infection, unspecified: Secondary | ICD-10-CM | POA: Diagnosis not present

## 2023-03-25 DIAGNOSIS — B9789 Other viral agents as the cause of diseases classified elsewhere: Secondary | ICD-10-CM | POA: Insufficient documentation

## 2023-03-25 DIAGNOSIS — Z1152 Encounter for screening for COVID-19: Secondary | ICD-10-CM | POA: Diagnosis not present

## 2023-03-25 DIAGNOSIS — R059 Cough, unspecified: Secondary | ICD-10-CM | POA: Diagnosis present

## 2023-03-25 LAB — SARS CORONAVIRUS 2 BY RT PCR: SARS Coronavirus 2 by RT PCR: NEGATIVE

## 2023-03-25 MED ORDER — ALBUTEROL SULFATE HFA 108 (90 BASE) MCG/ACT IN AERS
2.0000 | INHALATION_SPRAY | RESPIRATORY_TRACT | 0 refills | Status: AC | PRN
Start: 2023-03-25 — End: ?

## 2023-03-25 MED ORDER — PREDNISONE 10 MG (21) PO TBPK: ORAL_TABLET | Freq: Every day | ORAL | 0 refills | Status: AC

## 2023-03-25 MED ORDER — BENZONATATE 100 MG PO CAPS: 100.0000 mg | ORAL_CAPSULE | Freq: Three times a day (TID) | ORAL | 0 refills | Status: DC

## 2023-03-25 MED ORDER — PROMETHAZINE-DM 6.25-15 MG/5ML PO SYRP: 5.0000 mL | ORAL_SOLUTION | Freq: Four times a day (QID) | ORAL | 0 refills | Status: DC | PRN

## 2023-03-25 NOTE — Discharge Instructions (Signed)
Stop by the pharmacy to pick up your prescriptions.  Follow up with your primary care provider as needed.  

## 2023-03-25 NOTE — ED Provider Notes (Signed)
[facility]    CSN: [id number] Arrival date & time: 03/25/23  1137      History   Chief Complaint Chief Complaint  Patient presents with   Cough    HPI Kayla Love is a 72 y.o. female.   HPI  History obtained from the patient. Kayla Love presents for headache, cough, chest tightness with coughing, lightheadedness. Feels like she has been upchucking phlegm.  Her grandson has been sick.  She takes her allergy medication but this is not helping,.   Fever : no  Chills: not new  Sore throat: scratching  Cough: yes Sputum: yes Chest tightness: yes Shortness of breath: no Wheezing: no  Nasal congestion : no  Rhinorrhea: yes Myalgias: no Appetite: decreased Hydration: normal  Abdominal pain: no Nausea: yes Vomiting: no Diarrhea: No Rash: No Sleep disturbance: yes  Headache: yes      Past Medical History:  Diagnosis Date   Allergic rhinitis    Atherosclerosis of abdominal aorta (HCC)    Atypical nevus    Bursitis of right shoulder    Cervical radiculitis    Cervicalgia    Degenerative joint disease    Diabetes mellitus without complication (HCC)    Diverticulosis    Dysphagia, oropharyngeal    Elevated ferritin    Family history of colon cancer    Family history of colonic polyps    Family history of gastric cancer    Family history of leukemia    Family history of lung cancer    Family history of lung cancer    Family history of ovarian cancer    Family history of ovarian cancer    Family history of prostate cancer    Frequent falls    Generalized anxiety disorder    GERD (gastroesophageal reflux disease)    Goiter    History of angina    History of esophageal stricture    had esophageal stricture dilated 2002   Hyperlipidemia    Hypomagnesemia    Internal hemorrhoid    Lymphadenopathy    Muscle cramp    Obesity    Peripheral neuropathy    Stress incontinence    Tinnitus, bilateral    Vertigo     Patient Active Problem List    Diagnosis Date Noted   Genetic testing 01/23/2022   Family history of prostate cancer 01/05/2022   Family history of ovarian cancer 01/05/2022   Family history of lung cancer 01/05/2022   Family history of leukemia 01/05/2022   Elevated ferritin 12/22/2021   Arthralgia 12/22/2021    Past Surgical History:  Procedure Laterality Date   CHOLECYSTECTOMY     COLONOSCOPY  2000   COLONOSCOPY  2005   COLONOSCOPY  07/2011   COLONOSCOPY  11/04/2019   FHCC/FHCP/Diverticulosis/Internal hemorrhoids/Otherwise normal - 5 year repeat per [facility].   ESOPHAGEAL DILATION  2002   ESOPHAGOGASTRODUODENOSCOPY  12/1998   ESOPHAGOGASTRODUODENOSCOPY  08/2004   ESOPHAGOGASTRODUODENOSCOPY  07/27/2011   ESOPHAGOGASTRODUODENOSCOPY (EGD) WITH PROPOFOL N/A 07/03/2022   Procedure: ESOPHAGOGASTRODUODENOSCOPY (EGD) WITH PROPOFOL;  Surgeon: Regis Bill, MD;  Location: [facility] ENDOSCOPY;  Service: Endoscopy;  Laterality: N/A;  DM   LAPAROSCOPIC TUBAL LIGATION  1994   TONSILLECTOMY AND ADENOIDECTOMY      OB History   No obstetric history on file.      Home Medications    Prior to Admission medications   Medication Sig Start Date End Date Taking? Authorizing Provider  albuterol (VENTOLIN HFA) 108 (90 Base) MCG/ACT inhaler Inhale 2 puffs  into the lungs every 4 (four) hours as needed. 03/25/23  Yes Hazyl Marseille, DO  benzonatate (TESSALON) 100 MG capsule Take 1 capsule (100 mg total) by mouth every 8 (eight) hours. 03/25/23  Yes Ameliah Baskins, DO  predniSONE (STERAPRED UNI-PAK 21 TAB) 10 MG (21) TBPK tablet Take by mouth daily. Take 6 tabs by mouth daily for 1, then 5 tabs for 1 day, then 4 tabs for 1 day, then 3 tabs for 1 day, then 2 tabs for 1 day, then 1 tab for 1 day. 03/25/23  Yes Glendi Mohiuddin, DO  promethazine-dextromethorphan (PROMETHAZINE-DM) 6.25-15 MG/5ML syrup Take 5 mLs by mouth 4 (four) times daily as needed. 03/25/23  Yes Joeli Fenner, DO  ALPRAZolam (XANAX) 0.5 MG tablet Take 1 tablet (0.5 mg  total) by mouth at bedtime as needed for anxiety. 0.5 mg 30 minutes before procedure; if needed, you repeat the dose after 30  minutes 01/31/22   Rickard Patience, MD  aspirin 81 MG chewable tablet Chew 1 tablet by mouth daily.    [provider]  baclofen (LIORESAL) 10 MG tablet Take 10 mg by mouth 2 (two) times daily. 11/17/21   [provider]  cetirizine (ZYRTEC) 10 MG tablet Take 10 mg by mouth daily.    [provider]  ezetimibe (ZETIA) 10 MG tablet Take 1 tablet by mouth daily. 12/01/20   [provider]  famotidine (PEPCID) 20 MG tablet Take 20 mg by mouth daily. 09/19/21   [provider]  gabapentin (NEURONTIN) 300 MG capsule Take 1 capsule by mouth 2 (two) times daily. 01/04/18   [provider]  JARDIANCE 10 MG TABS tablet Take 10 mg by mouth daily. 09/19/21   [provider]  lisinopril (ZESTRIL) 2.5 MG tablet Take 1 tablet by mouth daily. 10/01/20   [provider]  magnesium oxide (MAG-OX) 400 MG tablet Take 1 tablet by mouth daily. 12/20/21   [provider]  Magnesium Oxide 250 MG TABS Take 1 tablet by mouth daily. Patient not taking: Reported on 12/22/2021 07/12/20   [provider]  meclizine (ANTIVERT) 25 MG tablet Take 1 tablet by mouth every 6 (six) hours as needed for dizziness. 04/28/20   [provider]  meloxicam (MOBIC) 15 MG tablet Take 1 tablet by mouth daily. 12/01/16   [provider]  metFORMIN (GLUCOPHAGE) 1000 MG tablet Take 1,000 mg by mouth 2 (two) times daily. 12/20/21   [provider]  metFORMIN (GLUCOPHAGE) 500 MG tablet Take 500 mg by mouth 2 (two) times daily with a meal. Patient not taking: Reported on 12/22/2021    [provider]  omeprazole (PRILOSEC) 40 MG capsule Take 1 capsule by mouth daily. 07/19/21   [provider]  polyethylene glycol powder (GLYCOLAX/MIRALAX) 17 GM/SCOOP powder Take as directed for colonic prep. 10/13/19   [provider]  traZODone (DESYREL) 50 MG tablet Take 1 tablet by mouth at bedtime as needed for sleep. 05/06/20   [provider]    Family History Family History  Problem Relation Age of Onset   Lung cancer Mother        d. 16s   Hypertension Father    Sickle cell anemia Sister    Hypertension Sister    Diabetes Sister    Leukemia Sister        d. 56s   Ovarian cancer Sister        d. 74s   Colon polyps Brother    Lung cancer Brother  d. 77s   Hypertension Brother    Lung cancer Brother        d. 47s   Colon polyps Brother    Prostate cancer Brother        dx 31s   Colon polyps Brother    Splenomegaly Brother        mets to spleen   Dementia Brother    Colon polyps Brother    Ulcers Brother    Prostate cancer Brother        dx 60   Cancer Maternal Aunt        unk types   Cancer Maternal Uncle        unk type   Cancer Cousin        unknown type of cancer    Social History Social History   Tobacco Use   Smoking status: Never    Passive exposure: Never   Smokeless tobacco: Never  Vaping Use   Vaping Use: Never used  Substance Use Topics   Alcohol use: No   Drug use: Never     Allergies   Other, Codeine, and Shellfish allergy   Review of Systems Review of Systems: negative unless otherwise stated in HPI.      Physical Exam Triage Vital Signs ED Triage Vitals  Enc Vitals Group     BP 03/25/23 1214 117/72     Pulse Rate 03/25/23 1214 88     Resp 03/25/23 1214 14     Temp 03/25/23 1214 98.4 F (36.9 C)     Temp Source 03/25/23 1214 Oral     SpO2 03/25/23 1214 97 %     Weight 03/25/23 1212 201 lb 15.1 oz (91.6 kg)     Height 03/25/23 1212 5\' 5"  (1.651 m)     Head Circumference --      Peak Flow --      Pain Score 03/25/23 1212 0     Pain Loc --      Pain Edu? --      Excl. in GC? --    No data found.  Updated Vital Signs BP 117/72 (BP Location: Right Arm)   Pulse 88   Temp 98.4 F (36.9 C) (Oral)   Resp 14   Ht 5'  5" (1.651 m)   Wt 91.6 kg   SpO2 97%   BMI 33.60 kg/m   Visual Acuity Right Eye Distance:   Left Eye Distance:   Bilateral Distance:    Right Eye Near:   Left Eye Near:    Bilateral Near:     Physical Exam GEN:     alert, non-toxic appearing female in no distress  HENT:  mucus membranes moist, oropharyngeal without lesions or erythema, no tonsillar hypertrophy or exudates,  moderate erythematous edematous turbinates, clear nasal discharge, bilateral TM normal EYES:   pupils equal and reactive,no scleral injection or discharge NECK:  good  ROM, no lymphadenopathy, no meningismus   RESP:  no increased work of breathing, coarse breath sounds and productive cough CVS:   regular rate and rhythm Skin:   warm and dry, no rash on visible skin    UC Treatments / Results  Labs (all labs ordered are listed, but only abnormal results are displayed) Labs Reviewed  SARS CORONAVIRUS 2 BY RT PCR    EKG   Radiology No results found.  Procedures Procedures (including critical care time)  Medications Ordered in UC Medications - No data to display  Initial Impression / Assessment  and Plan / UC Course  I have reviewed the triage vital signs and the nursing notes.  Pertinent labs & imaging results that were available during my care of the patient were reviewed by me and considered in my medical decision making (see chart for details).       Pt is a 72 y.o. female who presents for 2 days of respiratory symptoms. Aoki is afebrile here. Satting well on room air. Overall pt is non-toxic appearing, well hydrated, without respiratory distress. Pulmonary exam is remarkable coarse breath sounds.  COVID testing obtained and was negative. History consistent with viral respiratory illness. Discussed symptomatic treatment.  Explained lack of efficacy of antibiotics in viral disease.  Typical duration of symptoms discussed.  Given prednisone taper, albuterol inhaler, Tessalon Perles and  Promethazine DM.  Return and ED precautions given and voiced understanding. Discussed MDM, treatment plan and plan for follow-up with patient who agrees with plan.     Final Clinical Impressions(s) / UC Diagnoses   Final diagnoses:  Viral URI with cough     Discharge Instructions      Stop by the pharmacy to pick up your prescriptions.  Follow up with your primary care provider as needed.      ED Prescriptions     Medication Sig Dispense Auth. Provider   predniSONE (STERAPRED UNI-PAK 21 TAB) 10 MG (21) TBPK tablet Take by mouth daily. Take 6 tabs by mouth daily for 1, then 5 tabs for 1 day, then 4 tabs for 1 day, then 3 tabs for 1 day, then 2 tabs for 1 day, then 1 tab for 1 day. 21 tablet Ruth Kovich, DO   albuterol (VENTOLIN HFA) 108 (90 Base) MCG/ACT inhaler Inhale 2 puffs into the lungs every 4 (four) hours as needed. 6.7 g Vlad Mayberry, DO   benzonatate (TESSALON) 100 MG capsule Take 1 capsule (100 mg total) by mouth every 8 (eight) hours. 21 capsule Joene Gelder, DO   promethazine-dextromethorphan (PROMETHAZINE-DM) 6.25-15 MG/5ML syrup Take 5 mLs by mouth 4 (four) times daily as needed. 118 mL Katha Cabal, DO      PDMP not reviewed this encounter.   Katha Cabal, DO 03/28/23 1140

## 2023-03-25 NOTE — ED Triage Notes (Signed)
Patient c/o cough, chest congestion and nasal congestion that started 2 days ago.  Patient denies fevers.

## 2023-08-10 ENCOUNTER — Other Ambulatory Visit: Payer: Self-pay | Admitting: Family Medicine

## 2023-08-10 DIAGNOSIS — Z1231 Encounter for screening mammogram for malignant neoplasm of breast: Secondary | ICD-10-CM

## 2023-10-06 ENCOUNTER — Ambulatory Visit
Admission: EM | Admit: 2023-10-06 | Discharge: 2023-10-06 | Disposition: A | Discharge status: Home / Self Care | Payer: Medicare Other | Attending: Emergency Medicine | Admitting: Emergency Medicine

## 2023-10-06 ENCOUNTER — Encounter: Payer: Self-pay | Admitting: Emergency Medicine

## 2023-10-06 DIAGNOSIS — J309 Allergic rhinitis, unspecified: Secondary | ICD-10-CM | POA: Diagnosis present

## 2023-10-06 DIAGNOSIS — R0982 Postnasal drip: Secondary | ICD-10-CM | POA: Insufficient documentation

## 2023-10-06 LAB — GROUP A STREP BY PCR: Group A Strep by PCR: NOT DETECTED

## 2023-10-06 MED ORDER — BENZONATATE 100 MG PO CAPS: 100.0000 mg | ORAL_CAPSULE | Freq: Three times a day (TID) | ORAL | 0 refills | Status: AC

## 2023-10-06 MED ORDER — IPRATROPIUM BROMIDE 0.06 % NA SOLN: 2.0000 | Freq: Four times a day (QID) | NASAL | 12 refills | Status: AC

## 2023-10-06 MED ORDER — PROMETHAZINE-DM 6.25-15 MG/5ML PO SYRP: 5.0000 mL | ORAL_SOLUTION | Freq: Four times a day (QID) | ORAL | 0 refills | Status: AC | PRN

## 2023-10-06 NOTE — ED Provider Notes (Signed)
[facility]    CSN: [id number] Arrival date & time: 10/06/23  0809      History   Chief Complaint Chief Complaint  Patient presents with   Headache   Cough   Sore Throat    HPI Kayla Love is a 72 y.o. female.   HPI  72 year old female with a past medical history significant for diabetes, goiter, GERD, and dysphagia presents for evaluation of respiratory symptoms that started over a week ago.  She endorses nasal congestion with postnasal drip, subjective fever and sweats, sore throat, right ear pain, nonproductive cough, shortness breath, and wheezing.  Past Medical History:  Diagnosis Date   Allergic rhinitis    Atherosclerosis of abdominal aorta (HCC)    Atypical nevus    Bursitis of right shoulder    Cervical radiculitis    Cervicalgia    Degenerative joint disease    Diabetes mellitus without complication (HCC)    Diverticulosis    Dysphagia, oropharyngeal    Elevated ferritin    Family history of colon cancer    Family history of colonic polyps    Family history of gastric cancer    Family history of leukemia    Family history of lung cancer    Family history of lung cancer    Family history of ovarian cancer    Family history of ovarian cancer    Family history of prostate cancer    Frequent falls    Generalized anxiety disorder    GERD (gastroesophageal reflux disease)    Goiter    History of angina    History of esophageal stricture    had esophageal stricture dilated 2002   Hyperlipidemia    Hypomagnesemia    Internal hemorrhoid    Lymphadenopathy    Muscle cramp    Obesity    Peripheral neuropathy    Stress incontinence    Tinnitus, bilateral    Vertigo     Patient Active Problem List   Diagnosis Date Noted   Genetic testing 01/23/2022   Family history of prostate cancer 01/05/2022   Family history of ovarian cancer 01/05/2022   Family history of lung cancer 01/05/2022   Family history of leukemia 01/05/2022   Elevated  ferritin 12/22/2021   Arthralgia 12/22/2021    Past Surgical History:  Procedure Laterality Date   CHOLECYSTECTOMY     COLONOSCOPY  2000   COLONOSCOPY  2005   COLONOSCOPY  07/2011   COLONOSCOPY  11/04/2019   FHCC/FHCP/Diverticulosis/Internal hemorrhoids/Otherwise normal - 5 year repeat per [facility].   ESOPHAGEAL DILATION  2002   ESOPHAGOGASTRODUODENOSCOPY  12/1998   ESOPHAGOGASTRODUODENOSCOPY  08/2004   ESOPHAGOGASTRODUODENOSCOPY  07/27/2011   ESOPHAGOGASTRODUODENOSCOPY (EGD) WITH PROPOFOL N/A 07/03/2022   Procedure: ESOPHAGOGASTRODUODENOSCOPY (EGD) WITH PROPOFOL;  Surgeon: Regis Bill, MD;  Location: [facility];  Service: Endoscopy;  Laterality: N/A;  DM   LAPAROSCOPIC TUBAL LIGATION  1994   TONSILLECTOMY AND ADENOIDECTOMY      OB History   No obstetric history on file.      Home Medications    Prior to Admission medications   Medication Sig Start Date End Date Taking? Authorizing Provider  ipratropium (ATROVENT) 0.06 % nasal spray Place 2 sprays into both nostrils 4 (four) times daily. 10/06/23  Yes Becky Augusta, NP  albuterol (VENTOLIN HFA) 108 (90 Base) MCG/ACT inhaler Inhale 2 puffs into the lungs every 4 (four) hours as needed. 03/25/23   Katha Cabal, DO  ALPRAZolam (XANAX) 0.5 MG tablet Take 1  tablet (0.5 mg total) by mouth at bedtime as needed for anxiety. 0.5 mg 30 minutes before procedure; if needed, you repeat the dose after 30  minutes 01/31/22   Rickard Patience, MD  aspirin 81 MG chewable tablet Chew 1 tablet by mouth daily.    [provider]  baclofen (LIORESAL) 10 MG tablet Take 10 mg by mouth 2 (two) times daily. 11/17/21   [provider]  benzonatate (TESSALON) 100 MG capsule Take 1 capsule (100 mg total) by mouth every 8 (eight) hours. 10/06/23   Becky Augusta, NP  cetirizine (ZYRTEC) 10 MG tablet Take 10 mg by mouth daily.    [provider]  ezetimibe (ZETIA) 10 MG tablet Take 1 tablet by mouth daily. 12/01/20   [provider]  famotidine (PEPCID) 20 MG tablet Take 20 mg by mouth daily. 09/19/21   [provider]  gabapentin (NEURONTIN) 300 MG capsule Take 1 capsule by mouth 2 (two) times daily. 01/04/18   [provider]  JARDIANCE 10 MG TABS tablet Take 10 mg by mouth daily. 09/19/21   [provider]  lisinopril (ZESTRIL) 2.5 MG tablet Take 1 tablet by mouth daily. 10/01/20   [provider]  magnesium oxide (MAG-OX) 400 MG tablet Take 1 tablet by mouth daily. 12/20/21   [provider]  Magnesium Oxide 250 MG TABS Take 1 tablet by mouth daily. Patient not taking: Reported on 12/22/2021 07/12/20   [provider]  meclizine (ANTIVERT) 25 MG tablet Take 1 tablet by mouth every 6 (six) hours as needed for dizziness. 04/28/20   [provider]  meloxicam (MOBIC) 15 MG tablet Take 1 tablet by mouth daily. 12/01/16   [provider]  metFORMIN (GLUCOPHAGE) 1000 MG tablet Take 1,000 mg by mouth 2 (two) times daily. 12/20/21   [provider]  metFORMIN (GLUCOPHAGE) 500 MG tablet Take 500 mg by mouth 2 (two) times daily with a meal. Patient not taking: Reported on 12/22/2021    [provider]  omeprazole (PRILOSEC) 40 MG capsule Take 1 capsule by mouth daily. 07/19/21   [provider]  polyethylene glycol powder (GLYCOLAX/MIRALAX) 17 GM/SCOOP powder Take as directed for colonic prep. 10/13/19   [provider]  predniSONE (STERAPRED UNI-PAK 21 TAB) 10 MG (21) TBPK tablet Take by mouth daily. Take 6 tabs by mouth daily for 1, then 5 tabs for 1 day, then 4 tabs for 1 day, then 3 tabs for 1 day, then 2 tabs for 1 day, then 1 tab for 1 day. 03/25/23   Katha Cabal, DO  promethazine-dextromethorphan (PROMETHAZINE-DM) 6.25-15 MG/5ML syrup Take 5 mLs by mouth 4 (four) times daily as needed. 10/06/23   Becky Augusta, NP  traZODone (DESYREL) 50 MG tablet Take 1 tablet by mouth at bedtime as needed for sleep. 05/06/20   [provider]    Family History Family History  Problem Relation Age of Onset   Lung cancer Mother        d. 84s   Hypertension Father    Sickle cell anemia Sister    Hypertension Sister    Diabetes Sister    Leukemia Sister        d. 36s   Ovarian cancer Sister        d. 74s   Colon polyps Brother    Lung cancer Brother        d. 75s   Hypertension Brother    Lung cancer Brother  d. 71s   Colon polyps Brother    Prostate cancer Brother        dx 25s   Colon polyps Brother    Splenomegaly Brother        mets to spleen   Dementia Brother    Colon polyps Brother    Ulcers Brother    Prostate cancer Brother        dx 16   Cancer Maternal Aunt        unk types   Cancer Maternal Uncle        unk type   Cancer Cousin        unknown type of cancer    Social History Social History   Tobacco Use   Smoking status: Never    Passive exposure: Never   Smokeless tobacco: Never  Vaping Use   Vaping status: Never Used  Substance Use Topics   Alcohol use: No   Drug use: Never     Allergies   Other, Codeine, and Shellfish allergy   Review of Systems Review of Systems  Constitutional:  Positive for fever.  HENT:  Positive for congestion, ear pain, postnasal drip and sore throat. Negative for rhinorrhea.   Respiratory:  Positive for cough, shortness of breath and wheezing.      Physical Exam Triage Vital Signs ED Triage Vitals  Encounter Vitals Group     BP      Systolic BP Percentile      Diastolic BP Percentile      Pulse      Resp      Temp      Temp src      SpO2      Weight      Height      Head Circumference      Peak Flow      Pain Score      Pain Loc      Pain Education      Exclude from Growth Chart    No data found.  Updated Vital Signs BP 120/80 (BP Location: Left Arm)   Pulse 98   Temp 99 F (37.2 C) (Oral)   Resp 14   Ht 5\' 5"  (1.651 m)   Wt 204 lb (92.5 kg)   SpO2 95%   BMI 33.95 kg/m   Visual Acuity Right Eye  Distance:   Left Eye Distance:   Bilateral Distance:    Right Eye Near:   Left Eye Near:    Bilateral Near:     Physical Exam Vitals and nursing note reviewed.  Constitutional:      Appearance: Normal appearance. She is not ill-appearing.  HENT:     Head: Normocephalic and atraumatic.     Right Ear: Tympanic membrane, ear canal and external ear normal. There is no impacted cerumen.     Left Ear: Tympanic membrane, ear canal and external ear normal. There is no impacted cerumen.     Ears:     Comments: Mild cerumen in right EAC the tympanic membrane is clearly visible and pearly gray in appearance.    Nose: Congestion and rhinorrhea present.     Comments: Nasal mucosa is edematous and pale with clear rhinorrhea.    Mouth/Throat:     Mouth: Mucous membranes are moist.     Pharynx: Oropharynx is clear. Posterior oropharyngeal erythema present. No oropharyngeal exudate.     Comments: Mild erythema to the posterior oropharynx with clear postnasal drip. Cardiovascular:  Rate and Rhythm: Normal rate and regular rhythm.     Pulses: Normal pulses.     Heart sounds: Normal heart sounds. No murmur heard.    No friction rub. No gallop.  Pulmonary:     Effort: Pulmonary effort is normal.     Breath sounds: Normal breath sounds. No wheezing, rhonchi or rales.  Musculoskeletal:     Cervical back: Normal range of motion and neck supple. No tenderness.  Lymphadenopathy:     Cervical: No cervical adenopathy.  Skin:    General: Skin is warm and dry.     Capillary Refill: Capillary refill takes less than 2 seconds.     Findings: No rash.  Neurological:     General: No focal deficit present.     Mental Status: She is alert and oriented to person, place, and time.      UC Treatments / Results  Labs (all labs ordered are listed, but only abnormal results are displayed) Labs Reviewed  GROUP A STREP BY PCR    EKG   Radiology No results found.  Procedures Procedures (including  critical care time)  Medications Ordered in UC Medications - No data to display  Initial Impression / Assessment and Plan / UC Course  I have reviewed the triage vital signs and the nursing notes.  Pertinent labs & imaging results that were available during my care of the patient were reviewed by me and considered in my medical decision making (see chart for details).   Patient is a pleasant, nontoxic-appearing 52-year-old female presenting for evaluation of greater than 1 week worth of respiratory symptoms as outlined HPI above.  Strep PCR was ordered and is negative.  Her physical exam reveals edematous and pale nasal mucosa with clear rhinorrhea as well as mild erythema to the posterior oropharynx and clear postnasal drip.  This picture is more consistent with allergic rhinitis.  She reports that she is not currently taking any over-the-counter antihistamines or using the Flonase that she is been prescribed.  I have encouraged her to start doing both.  I will also add on Atrovent nasal spray to help with the mucosal inflammation and cut down on the mucus production to resolve the postnasal drip.  I will also prescribe Tessalon Perles and Promethazine DM cough syrup that she can use until her allergy medication takes effect.  Return precautions reviewed.   Final Clinical Impressions(s) / UC Diagnoses   Final diagnoses:  Allergic rhinitis with postnasal drip     Discharge Instructions      Take an over-the-counter antihistamine such as Allegra 180 mg, Claritin or Zyrtec 10 mg, once daily for control of your allergy symptoms.  Perform sinus irrigation with a NeilMed sinus rinse kit and distilled water 2-3 times a day to wash away pollen particles and mold spores which could be attributing to nasal congestion and mucus production.  Use the Atrovent nasal spray, 2 squirts up each nostril 4 times a day, as needed for nasal congestion and postnasal drip.  At bedtime use the Flonase nasal  spray, 2 squirts up each nostril.  Point the nozzle away from your septum so as to minimize the risk of nosebleed.  Return for reevaluation for any new or worsening symptoms.      ED Prescriptions     Medication Sig Dispense Auth. Provider   benzonatate (TESSALON) 100 MG capsule Take 1 capsule (100 mg total) by mouth every 8 (eight) hours. 21 capsule Becky Augusta, NP   promethazine-dextromethorphan (PROMETHAZINE-DM)  6.25-15 MG/5ML syrup Take 5 mLs by mouth 4 (four) times daily as needed. 118 mL Becky Augusta, NP   ipratropium (ATROVENT) 0.06 % nasal spray Place 2 sprays into both nostrils 4 (four) times daily. 15 mL Becky Augusta, NP      PDMP not reviewed this encounter.   Becky Augusta, NP 10/06/23 [phone]

## 2023-10-06 NOTE — Discharge Instructions (Addendum)
Take an over-the-counter antihistamine such as Allegra 180 mg, Claritin or Zyrtec 10 mg, once daily for control of your allergy symptoms.  Perform sinus irrigation with a NeilMed sinus rinse kit and distilled water 2-3 times a day to wash away pollen particles and mold spores which could be attributing to nasal congestion and mucus production.  Use the Atrovent nasal spray, 2 squirts up each nostril 4 times a day, as needed for nasal congestion and postnasal drip.  At bedtime use the Flonase nasal spray, 2 squirts up each nostril.  Point the nozzle away from your septum so as to minimize the risk of nosebleed.  Return for reevaluation for any new or worsening symptoms.

## 2023-10-06 NOTE — ED Triage Notes (Signed)
Patient reports sore throat, cough, chest congestion, and headache for over a week.  Patient unsure of fevers.

## 2023-10-11 ENCOUNTER — Ambulatory Visit
Admission: RE | Admit: 2023-10-11 | Discharge: 2023-10-11 | Disposition: A | Discharge status: Home / Self Care | Payer: Medicare Other | Source: Ambulatory Visit | Attending: Family Medicine | Admitting: Family Medicine

## 2023-10-11 DIAGNOSIS — Z1231 Encounter for screening mammogram for malignant neoplasm of breast: Secondary | ICD-10-CM | POA: Diagnosis present

## 2023-10-13 ENCOUNTER — Other Ambulatory Visit: Payer: Self-pay

## 2023-10-13 ENCOUNTER — Emergency Department
Admission: EM | Admit: 2023-10-13 | Discharge: 2023-10-13 | Discharge status: Against Medical Advice | Payer: Medicare Other | Attending: Emergency Medicine | Admitting: Emergency Medicine

## 2023-10-13 DIAGNOSIS — Z5321 Procedure and treatment not carried out due to patient leaving prior to being seen by health care provider: Secondary | ICD-10-CM | POA: Insufficient documentation

## 2023-10-13 DIAGNOSIS — R42 Dizziness and giddiness: Secondary | ICD-10-CM | POA: Insufficient documentation

## 2023-10-13 DIAGNOSIS — R112 Nausea with vomiting, unspecified: Secondary | ICD-10-CM | POA: Insufficient documentation

## 2023-10-13 DIAGNOSIS — R519 Headache, unspecified: Secondary | ICD-10-CM | POA: Insufficient documentation

## 2023-10-13 LAB — BASIC METABOLIC PANEL
Anion gap: 8 (ref 5–15)
BUN: 15 mg/dL (ref 8–23)
CO2: 25 mmol/L (ref 22–32)
Calcium: 9 mg/dL (ref 8.9–10.3)
Chloride: 105 mmol/L (ref 98–111)
Creatinine, Ser: 0.85 mg/dL (ref 0.44–1.00)
GFR, Estimated: >60 mL/min (ref >60)
Glucose, Bld: 138 mg/dL — ABNORMAL HIGH (ref 70–99)
Potassium: 3.6 mmol/L (ref 3.5–5.1)
Sodium: 138 mmol/L (ref 135–145)

## 2023-10-13 LAB — CBC
HCT: 37.7 % (ref 36.0–46.0)
Hemoglobin: 12.2 g/dL (ref 12.0–15.0)
MCH: 30.2 pg (ref 26.0–34.0)
MCHC: 32.4 g/dL (ref 30.0–36.0)
MCV: 93.3 fL (ref 80.0–100.0)
Platelets: 288 10*3/uL (ref 150–400)
RBC: 4.04 MIL/uL (ref 3.87–5.11)
RDW: 12.5 % (ref 11.5–15.5)
WBC: 5.8 10*3/uL (ref 4.0–10.5)
nRBC: 0 % (ref 0.0–0.2)

## 2023-10-13 NOTE — ED Triage Notes (Signed)
Pt to ed from home via POV for dizziness. Pt was just here earlier for same but left after triage and before being seen due to the long wait. Pt states "how long is the wait now bc I aint staying no more". This RN informed her it was about 4.5 hours currently. I will get PA who is in triage to come speak with patient before she decides to leave.

## 2023-10-13 NOTE — ED Triage Notes (Addendum)
Pt presents from home for frontal HA, N/V, dizziness x 5 days, worse today. Describes dizziness as the room spinning, not worse with head movement or position change, somewhat improved from when she first woke up. Denies abd pain, fever Not on a blood thinner H/o vertigo, takes meclizine

## 2023-10-13 NOTE — ED Notes (Signed)
Pt reports that she is leaving and would follow up with her PMD.

## 2023-10-13 NOTE — ED Provider Triage Note (Signed)
Emergency Medicine Provider Triage Evaluation Note  Kayla Love , a 72 y.o. female  was evaluated in triage.  Pt complains of vertigo. Reports the room is spinning. Patient was here earlier due to long wait times. She took meclizine today before coming the first time.   She had blood work drawn which I reviewed and was normal.   Review of Systems  Positive: dizziness Negative: SOB, CP  Physical Exam  There were no vitals taken for this visit. Gen:   Awake, no distress   Resp:  Normal effort  MSK:   Moves extremities without difficulty  Other:    Medical Decision Making  Medically screening exam initiated at 8:11 PM.  Appropriate orders placed.  Kayla Love was informed that the remainder of the evaluation will be completed by another provider, this initial triage assessment does not replace that evaluation, and the importance of remaining in the ED until their evaluation is complete.    Cameron Ali, PA-C 10/13/23 2013

## 2023-10-14 ENCOUNTER — Emergency Department
Admission: EM | Admit: 2023-10-14 | Discharge: 2023-10-14 | Disposition: A | Discharge status: Home / Self Care | Payer: Medicare Other | Source: Home / Self Care | Attending: Emergency Medicine | Admitting: Emergency Medicine

## 2023-10-14 ENCOUNTER — Emergency Department: Payer: Medicare Other

## 2023-10-14 ENCOUNTER — Encounter: Payer: Self-pay | Admitting: Radiology

## 2023-10-14 DIAGNOSIS — R42 Dizziness and giddiness: Secondary | ICD-10-CM

## 2023-10-14 MED ORDER — DIAZEPAM 2 MG PO TABS
2.0000 mg | ORAL_TABLET | Freq: Once | ORAL | Status: AC
  Administered 2023-10-14: 2 mg via ORAL
  Filled 2023-10-14: qty 1

## 2023-10-14 NOTE — ED Notes (Signed)
MRI notified of administration of valium and ready for exam.

## 2023-10-14 NOTE — ED Notes (Signed)
Patient returned from MRI at this time.  

## 2023-10-14 NOTE — ED Notes (Signed)
Patient given discharge instructions including importance of follow up appt with stated understanding. Patient stable and wheeled out to car on dispo.

## 2023-10-14 NOTE — ED Provider Notes (Signed)
[facility] Provider Note    Event Date/Time   First MD Initiated Contact with Patient 10/14/23 [phone]     (approximate)   History   Dizziness   HPI Kayla Love is a 72 y.o. female who presents for evaluation of dizziness.  She said that she has been dizzy in the past and has been told that she has vertigo but it has not been for a long time.  The dizziness started yesterday when she got up about 5 AM.  She stood up out of bed and had to sit back down because she felt the room spinning.  It has happened on and off over the course of the day.  She tried some meclizine but it did not help.  She said that if she is lying still it is okay but when she stands up or moves around, that is what creates the symptoms.  She has had some nausea when the dizziness is at its worst.  She denies recent fever, chest pain, and shortness of breath.  She has had what she describes as a head cold over the last week and occasionally she feels like her ears will pop and click but she feels less nasal congestion than she did last week.     Physical Exam   Triage Vital Signs: ED Triage Vitals  Encounter Vitals Group     BP 10/13/23 2010 124/66     Systolic BP Percentile --      Diastolic BP Percentile --      Pulse Rate 10/13/23 2010 83     Resp 10/13/23 2010 14     Temp 10/13/23 2010 98 F (36.7 C)     Temp Source 10/13/23 2010 Oral     SpO2 10/13/23 2010 96 %     Weight --      Height --      Head Circumference --      Peak Flow --      Pain Score 10/13/23 2009 0     Pain Loc --      Pain Education --      Exclude from Growth Chart --     Most recent vital signs: Vitals:   10/13/23 2010 10/14/23 0353  BP: 124/66 134/78  Pulse: 83 78  Resp: 14 16  Temp: 98 F (36.7 C) 98 F (36.7 C)  SpO2: 96% 95%    General: Awake, no distress.  Generally well-appearing, conversant. CV:  Good peripheral perfusion.  Regular rate and rhythm. Resp:  Normal effort. Speaking  easily and comfortably, no accessory muscle usage nor intercostal retractions.   Abd:  No distention.  Other:  No focal neurological deficits.  No dysmetria on finger-to-nose testing, good grip strength bilaterally, equal muscular strength in arms and legs, no dysarthria, no aphasia.  Eye exam is notable for equal and reactive pupils and no nystagmus on extraocular motion test.   ED Results / Procedures / Treatments   Labs (all labs ordered are listed, but only abnormal results are displayed) Labs Reviewed - No data to display   EKG  ED ECG REPORT I, Loleta Rose, the attending physician, personally viewed and interpreted this ECG.  Date: 10/13/2023 EKG Time: 14: 46 Rate: 90 Rhythm: normal sinus rhythm QRS Axis: Right axis deviation Intervals: normal ST/T Wave abnormalities: Non-specific ST segment / T-wave changes, but no clear evidence of acute ischemia. Narrative Interpretation: no definitive evidence of acute ischemia; does not meet STEMI criteria.  RADIOLOGY I viewed and interpreted the patient's MRI brain.  See hospital course for details   PROCEDURES:  Critical Care performed: No  Procedures    IMPRESSION / MDM / ASSESSMENT AND PLAN / ED COURSE  I reviewed the triage vital signs and the nursing notes.                              Differential diagnosis includes, but is not limited to, benign vertigo, central vertigo (CVA), electrolyte or metabolic abnormality, dehydration, viral URI.  Patient's presentation is most consistent with acute presentation with potential threat to life or bodily function.  Labs/studies ordered: MR brain  Interventions/Medications given:  Medications  diazepam (VALIUM) tablet 2 mg (2 mg Oral Given 10/14/23 0351)    (Note:  hospital course my include additional interventions and/or labs/studies not listed above.)   Labs were performed within the last 24 hours and were all within normal limits.  Vital signs are normal.   Neurological exam is reassuring with no focal neurological deficits.  I suspect mild viral URI causing some exacerbation of a chronic predisposition towards benign vertigo.  However given her age and her concern over the symptoms, I proceeded with MR brain.  She had no evidence of CVA on her MRI.  She felt a little bit better after giving her Valium 2 mg p.o. which was to both help treat the vertigo and to help with her anxiety about going to the MRI.  She and her daughter are comfortable with the plan for discharge and outpatient follow-up.  I gave my usual and customary return precautions and encouraged cautious use of meclizine that she already has at home.         FINAL CLINICAL IMPRESSION(S) / ED DIAGNOSES   Final diagnoses:  Vertigo     Rx / DC Orders   ED Discharge Orders     None        Note:  This document was prepared using Dragon voice recognition software and may include unintentional dictation errors.   Loleta Rose, MD 10/14/23 [phone]

## 2023-10-14 NOTE — ED Notes (Signed)
 Patient taken for MRI at this time

## 2023-10-14 NOTE — Discharge Instructions (Addendum)
We believe your symptoms were caused by benign vertigo.  Please read through the included information and take any prescribed medication(s).  Follow up with your doctor as listed above.  If you develop any new or worsening symptoms that concern you, including but not limited to persistent dizziness/vertigo, numbness or weakness in your arms or legs, altered mental status, persistent vomiting, or fever greater than 101, please return immediately to the Emergency Department.  

## 2024-11-12 ENCOUNTER — Encounter: Payer: Self-pay | Admitting: Family Medicine
